# Patient Record
Sex: Female | Born: 1945 | Race: White | Hispanic: No | Marital: Married | Smoking: Never smoker
Health system: Southern US, Community
[De-identification: ages and names within clinical notes are randomized; demographics above are authoritative.]

## PROBLEM LIST (undated history)

## (undated) ENCOUNTER — Emergency Department (HOSPITAL_COMMUNITY): Admission: EM

## (undated) DIAGNOSIS — E7800 Pure hypercholesterolemia, unspecified: Secondary | ICD-10-CM

## (undated) DIAGNOSIS — F419 Anxiety disorder, unspecified: Secondary | ICD-10-CM

## (undated) DIAGNOSIS — R569 Unspecified convulsions: Secondary | ICD-10-CM

## (undated) DIAGNOSIS — I639 Cerebral infarction, unspecified: Secondary | ICD-10-CM

## (undated) DIAGNOSIS — R1310 Dysphagia, unspecified: Secondary | ICD-10-CM

## (undated) HISTORY — DX: Pure hypercholesterolemia, unspecified: E78.00

## (undated) HISTORY — DX: Dysphagia, unspecified: R13.10

## (undated) HISTORY — DX: Anxiety disorder, unspecified: F41.9

---

## 2000-01-05 HISTORY — PX: OTHER SURGICAL HISTORY: SHX169

## 2004-12-28 ENCOUNTER — Other Ambulatory Visit: Admission: RE | Admit: 2004-12-28 | Discharge: 2004-12-28 | Payer: Self-pay | Admitting: Family Medicine

## 2006-08-25 ENCOUNTER — Other Ambulatory Visit: Admission: RE | Admit: 2006-08-25 | Discharge: 2006-08-25 | Payer: Self-pay | Admitting: Family Medicine

## 2007-10-05 ENCOUNTER — Encounter: Admission: RE | Admit: 2007-10-05 | Discharge: 2007-10-05 | Payer: Self-pay | Admitting: Family Medicine

## 2008-05-09 ENCOUNTER — Other Ambulatory Visit: Admission: RE | Admit: 2008-05-09 | Discharge: 2008-05-09 | Payer: Self-pay | Admitting: Family Medicine

## 2008-05-18 ENCOUNTER — Encounter: Admission: RE | Admit: 2008-05-18 | Discharge: 2008-05-18 | Payer: Self-pay | Admitting: Family Medicine

## 2009-07-01 ENCOUNTER — Other Ambulatory Visit: Admission: RE | Admit: 2009-07-01 | Discharge: 2009-07-01 | Payer: Self-pay | Admitting: Family Medicine

## 2009-07-22 ENCOUNTER — Encounter: Admission: RE | Admit: 2009-07-22 | Discharge: 2009-07-22 | Payer: Self-pay | Admitting: Family Medicine

## 2009-12-12 ENCOUNTER — Encounter: Admission: RE | Admit: 2009-12-12 | Discharge: 2009-12-12 | Payer: Self-pay | Admitting: Sports Medicine

## 2010-03-11 ENCOUNTER — Ambulatory Visit (HOSPITAL_COMMUNITY): Admission: RE | Admit: 2010-03-11 | Discharge: 2010-03-11 | Payer: Self-pay

## 2010-04-03 HISTORY — PX: OTHER SURGICAL HISTORY: SHX169

## 2010-07-06 HISTORY — PX: BACK SURGERY: SHX140

## 2010-08-10 ENCOUNTER — Other Ambulatory Visit
Admission: RE | Admit: 2010-08-10 | Discharge: 2010-08-10 | Payer: Self-pay | Source: Home / Self Care | Admitting: Family Medicine

## 2010-08-10 ENCOUNTER — Encounter: Admission: RE | Admit: 2010-08-10 | Discharge: 2010-08-10 | Payer: Self-pay | Admitting: Family Medicine

## 2014-01-22 ENCOUNTER — Ambulatory Visit
Admission: RE | Admit: 2014-01-22 | Discharge: 2014-01-22 | Disposition: A | Discharge status: Home / Self Care | Payer: Commercial Managed Care - HMO | Source: Ambulatory Visit | Attending: Physician Assistant | Admitting: Physician Assistant

## 2014-01-22 ENCOUNTER — Other Ambulatory Visit: Payer: Self-pay | Admitting: Physician Assistant

## 2014-01-22 DIAGNOSIS — M79673 Pain in unspecified foot: Secondary | ICD-10-CM

## 2014-09-24 DIAGNOSIS — I639 Cerebral infarction, unspecified: Secondary | ICD-10-CM

## 2014-09-24 HISTORY — DX: Cerebral infarction, unspecified: I63.9

## 2014-10-08 ENCOUNTER — Ambulatory Visit (INDEPENDENT_AMBULATORY_CARE_PROVIDER_SITE_OTHER): Payer: Commercial Managed Care - HMO | Admitting: Neurology

## 2014-10-08 ENCOUNTER — Encounter: Payer: Self-pay | Admitting: Neurology

## 2014-10-08 ENCOUNTER — Other Ambulatory Visit: Payer: Self-pay | Admitting: Neurology

## 2014-10-08 VITALS — BP 126/80 | HR 85 | Ht 64.5 in | Wt 157.0 lb

## 2014-10-08 DIAGNOSIS — R519 Headache, unspecified: Secondary | ICD-10-CM | POA: Insufficient documentation

## 2014-10-08 DIAGNOSIS — R404 Transient alteration of awareness: Secondary | ICD-10-CM

## 2014-10-08 DIAGNOSIS — G44229 Chronic tension-type headache, not intractable: Secondary | ICD-10-CM

## 2014-10-08 DIAGNOSIS — R51 Headache: Secondary | ICD-10-CM

## 2014-10-08 DIAGNOSIS — R0683 Snoring: Secondary | ICD-10-CM

## 2014-10-08 DIAGNOSIS — R55 Syncope and collapse: Secondary | ICD-10-CM

## 2014-10-08 DIAGNOSIS — R4182 Altered mental status, unspecified: Secondary | ICD-10-CM | POA: Insufficient documentation

## 2014-10-08 DIAGNOSIS — R0681 Apnea, not elsewhere classified: Secondary | ICD-10-CM

## 2014-10-08 MED ORDER — LEVETIRACETAM 500 MG PO TABS: 500.0000 mg | ORAL_TABLET | Freq: Two times a day (BID) | ORAL | Status: DC

## 2014-10-08 NOTE — Patient Instructions (Signed)
Overall you are doing fairly well but I do want to suggest a few things today:   Remember to drink plenty of fluid, eat healthy meals and do not skip any meals. Try to eat protein with a every meal and eat a healthy snack such as fruit or nuts in between meals. Try to keep a regular sleep-wake schedule and try to exercise daily, particularly in the form of walking, 20-30 minutes a day, if you can.   As far as your medications are concerned, I would like to suggest: Daily aspirin 81mg .   As far as diagnostic testing: Sleep study to evaluate for sleep apnea, EEG. Agree that cardiology workup is clinically indicated  I would like to see you back in 4 months, sooner if we need to. Please call us with any interim questions, concerns, problems, updates or refill requests.   Please also call us for any test results so we can go over those with you on the phone.  My clinical assistant and will answer any of your questions and relay your messages to me and also relay most of my messages to you.   Our phone number is [phone]. We also have an after hours call service for urgent matters and there is a physician on-call for urgent questions. For any emergencies you know to call 911 or go to the nearest emergency room

## 2014-10-08 NOTE — Addendum Note (Signed)
Addended by: Naomie DeanAHERN, Preciosa Bundrick B on: 10/08/2014 06:18 PM   Modules accepted: Level of Service

## 2014-10-08 NOTE — Procedures (Signed)
      History: Lindsey Lopez is a 69 year old patient with a recent blackout while visiting [place] around the 19th of January 2016. The patient blacked out, fell backwards. There was some transient weakness on the left greater than right side. The patient is being evaluated for the blackout.  This is a routine EEG. No skull defects are noted. Medications include vitamin D, Celexa, Plavix, Flexeril, Nexium, Prevacid, Mobic, Toprol, Lopressor, and vitamin E.  EEG classification: Dysrhythmia grade 2 left temporal  Description of the recording: The background rhythms of this recording consists of a relatively well-modulated medium amplitude alpha rhythm of 10 Hz that is reactive to eye opening and closure. As the record progresses, there appears to be relatively frequent dysrhythmic theta activity emanating from the left mid temporal lobe, occasionally associated with sharp transients. Photic stimulation is performed, and this results in a bilateral and symmetric photic driving response. A hyperventilation was performed, and this results in a minimal buildup of the background rhythm activities without significant slowing seen. At no time during this recording does there appear to be evidence of actual spike or spike-wave discharges. EKG monitor shows no evidence of cardiac rhythm abnormalities with a heart rate of 78.  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 No electrographic seizures were recorded, however.

## 2014-10-08 NOTE — Progress Notes (Signed)
[facility]    Provider:  Dr Lucia Gaskins Referring Provider: [name], NP Primary Care Physician:  [name], NP  CC:  Loss of consciousness  HPI:  Lindsey Lopez is a 69 y.o. female here as a referral from Dr. Shayne Alken for loss of consciousness. They drove to [place], on the 19th went to [place] [employer]. She was standing in line for a show and she felt like she was falling backwards. She was out for 10-15 minutes, totally blacked out. Husband provides most of information. Eyes closed. Everything turned black. No abnormal movements, loss of bowel or blader. She just laid there. There was no response. She was breathing, she has no recollection of events. Gradually she began to open her eyes but couldn't respond. A nurse in the crowd helped, and the more the nurse worked with her, the more responsive she became. Right arm and right leg weren't moving as well as the left side. The right side of the face drooped. After 15 minutes she responded better, they sat her up, This is the first time, never happened before. No inciting factors. She slowly got better, was very tired afterwards, slept for 2 days. She sometimes has some word-finding difficulties otherwise back to normal. She is on aspirin now (was not before). She is on a statin. No diabetes. There were no new medications or other inciting events.  EEg slowed bitemporal slowing. No personal or FHx of seizures. Never had a stroke.   Headaches are every day. Bitemporal pressure. She is very tense. Headache wake her up in the middle of the night. When she rolls on her back she starts breathing through her mouth, significant snoring to the point she wakes herself. She is excessively tired during the day (however Epworth scale only 2). She takes naps often. She has gained 25-30 pounds recently. She has witnessed apneic events by husband. Wakes up with headaches. Daily headaches.   Reviewed notes, labs and imaging from outside physicians,  which showed: MRI of the brain w/wo contrast unremarkable with some non-specific white matter periventricular increased signal, no infarct or mass or bleed, normal architecture, MRi of the cervical spine with postoperative changes with anterior cervical diskectomy and instrumentation c3-c7, solid fusion c3-c5, stenosis at c7-t1 and t1-t2 without cord flattening, no fracture or cord lesion. MRA neck w/wo: no significant stenosis interna carotid arteries or vertebral arteries. MRA head, normal. TEE with normal EF, abnormal left vent diastolic dysfunction, mild MR, trace TR, left vent nml size. Carotid duplex without hemodynamically significant stenosis in the bilat common, internal and external carotid arteries. EEG abnormal, intermittent bitemporal slpwing without interictal or epileptiform abnormalities.   Review of Systems: Patient complains of symptoms per HPI as well as the following symptoms: snoring, flushing, dizziness, passing out, aching muscles, spinning. Pertinent negatives per HPI. All others negative.   History   Social History  . Marital Status: Married    Spouse Name: [name]     Number of Children: 1  . Years of Education: College   Occupational History  . Retired    Social History Main Topics  . Smoking status: Never Smoker   . Smokeless tobacco: Not on file  . Alcohol Use: No  . Drug Use: No  . Sexual Activity: Not on file   Other Topics Concern  . Not on file   Social History Narrative   Lives at home with husband, Lindsey Lopez.   She is retired.    Has one child  Caffeine use: 1 cup a day    Family History  Problem Relation Age of Onset  . Prostate cancer Brother   . Cancer Mother   . Cancer Father   . Heart attack Mother   . Heart attack Father   . Heart attack Maternal Grandmother   . Heart attack Maternal Grandfather     Past Medical History  Diagnosis Date  . High cholesterol   . Anxiety     Past Surgical History  Procedure Laterality Date  .  Neck fusion  04-03-10    4 leve   . Back surgery  07-06-10    "Drill out" surgery on lower back  . Herniated disc  01-2000    Current Outpatient Prescriptions  Medication Sig Dispense Refill  . Cholecalciferol (VITAMIN D3) 2000 UNITS TABS Take 1 tablet by mouth daily.    . citalopram (CELEXA) 10 MG tablet Take 10 mg by mouth daily.    . cyclobenzaprine (FLEXERIL) 10 MG tablet Take 10 mg by mouth daily.    Marland Kitchen esomeprazole (NEXIUM) 20 MG capsule Take 20 mg by mouth daily at 12 noon.    . fluvastatin XL (LESCOL XL) 80 MG 24 hr tablet Take 80 mg by mouth daily.    . Lansoprazole (PREVACID PO) Take 1 tablet by mouth daily.    . meloxicam (MOBIC) 15 MG tablet Take 15 mg by mouth daily.    . metoprolol succinate (TOPROL-XL) 25 MG 24 hr tablet Take 12.5 mg by mouth daily.    . metoprolol tartrate (LOPRESSOR) 12.5 mg TABS tablet Take 12.5 mg by mouth 2 (two) times daily.    Marland Kitchen VITAMIN E PO Take 2,000 Units by mouth daily.    . clopidogrel (PLAVIX) 75 MG tablet Take 75 mg by mouth daily.    Marland Kitchen levETIRAcetam (KEPPRA) 500 MG tablet Take 1 tablet (500 mg total) by mouth 2 (two) times daily. 60 tablet 6   No current facility-administered medications for this visit.    Allergies as of 10/08/2014 - Review Complete 10/08/2014  Allergen Reaction Noted  . Codeine Anaphylaxis 10/08/2014  . Phenobarbital  10/08/2014  . Ultram [tramadol]  10/08/2014    Vitals: BP 126/80 mmHg  Pulse 85  Ht 5' 4.5" (1.638 m)  Wt 157 lb (71.215 kg)  BMI 26.54 kg/m2 Last Weight:  Wt Readings from Last 1 Encounters:  10/08/14 157 lb (71.215 kg)   Last Height:   Ht Readings from Last 1 Encounters:  10/08/14 5' 4.5" (1.638 m)  Physical exam: Exam: Gen: NAD, conversant, well nourised, overweight, well groomed                     CV: RRR, no MRG. No Carotid Bruits. No peripheral edema, warm, nontender Eyes: Conjunctivae clear without exudates or hemorrhage  Neuro: Detailed Neurologic Exam  Speech:    Speech is  normal; fluent and spontaneous with normal comprehension.  Cognition:    The patient is oriented to person, place, and time;     recent and remote memory intact;     language fluent;     normal attention, concentration,     fund of knowledge Cranial Nerves:    The pupils are equal, round, and reactive to light. The fundi are normal and spontaneous venous pulsations are present. Visual fields are full to finger confrontation. Extraocular movements are intact. Trigeminal sensation is intact and the muscles of mastication are normal. The face is symmetric. The palate elevates in the midline.  Hearing intact. Voice is normal. Shoulder shrug is normal. The tongue has normal motion without fasciculations.   Coordination:    No dysmetria  Gait:    Heel-toe and tandem gait are normal.   Motor Observation:    No asymmetry, no atrophy, and no involuntary movements noted. Tone:    Normal muscle tone.    Posture:    Posture is normal. normal erect    Strength:    Strength is V/V in the upper and lower limbs.      Sensation: intact to LT     Reflex Exam:  DTR's:    Deep tendon reflexes in the upper and lower extremities are normal bilaterally.   Toes:    The toes are downgoing bilaterally.   Clonus:    Clonus is absent.        Assessment/Plan:  69 year old female with loss of consciousness, right-sided weakness. 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 No electrographic seizures were recorded, however.) Personally reviewed EEG and agree with findings.   Will start patient on Keppra 500mg  bid. Advised no driving until 6 months - 1 year seizure free based  on locality laws, do not bathe or swim alone or do anything that could cause harm should you have a seizure. Follow up in 3 months.   Headache wake her up in the middle of the night. When she rolls on her back she starts breathing through her mouth, significant snoring to the point she wakes herself. She is excessively tired during the day (however Epworth scale only 2). She takes naps often. She has gained 25-30 pounds recently. She has witnessed apneic events by husband. Wakes up with headaches. Daily headaches.  Will order sleep study and have sleep team evaluate for appropriateness of study.  Continue ASA 81 and statin for stroke prevention. Follow closely with pcp for management of vascular risk factors. Recommend cardiology consult for syncopal episode that doesn't exactly fit seizure or stroke, possibly consider halter monitor.    Naomie DeanAntonia Jadelynn Boylan, MD  [facility] [address] [place] [id number]  Phone [phone] Fax [phone]

## 2014-10-09 NOTE — Progress Notes (Signed)
Done

## 2014-10-15 ENCOUNTER — Telehealth: Payer: Self-pay | Admitting: Neurology

## 2014-10-15 DIAGNOSIS — G479 Sleep disorder, unspecified: Secondary | ICD-10-CM

## 2014-10-15 DIAGNOSIS — G44011 Episodic cluster headache, intractable: Secondary | ICD-10-CM

## 2014-10-15 DIAGNOSIS — R5383 Other fatigue: Secondary | ICD-10-CM

## 2014-10-15 DIAGNOSIS — R0683 Snoring: Secondary | ICD-10-CM

## 2014-10-15 NOTE — Telephone Encounter (Signed)
Dr. Naomie DeanAntonia Ahern, refers patient for attended sleep study.  Height: 5' 4.5"  Weight: 157 lb  BMI: 26.54   Past Medical History:  High Cholesterol Anxiety   Sleep Symptoms: Headache wake her up in the middle of the night. When she rolls on her back she starts breathing through her mouth, significant snoring to the point she wakes herself. She is excessively tired during the day.She takes naps often. She has gained 25-30 pounds recently. She has witnessed apneic events by husband. Wakes up with headaches. Daily headaches.    Epworth Score: (however Epworth scale only 2)   Medication:  Cholecalciferol (Tab) Vitamin D3 2000 UNITS Take 1 tablet by mouth daily.       Citalopram Hydrobromide (Tab) CELEXA 10 MG Take 10 mg by mouth daily.      Clopidogrel Bisulfate (Tab) PLAVIX 75 MG Take 75 mg by mouth daily.      Cyclobenzaprine HCl (Tab) FLEXERIL 10 MG Take 10 mg by mouth daily.      Esomeprazole Magnesium (Capsule Delayed Release) NEXIUM 20 MG Take 20 mg by mouth daily at 12 noon.      Fluvastatin Sodium (Tablet SR 24 hr) LESCOL XL 80 MG Take 80 mg by mouth daily.      Lansoprazole   Take 1 tablet by mouth daily.      LevETIRAcetam (Tab) KEPPRA 500 MG Take 1 tablet (500 mg total) by mouth 2 (two) times daily.      Meloxicam (Tab) MOBIC 15 MG Take 15 mg by mouth daily.      Metoprolol Succinate (Tablet SR 24 hr) TOPROL-XL 25 MG Take 12.5 mg by mouth daily.      Metoprolol Tartrate (Tab) LOPRESSOR 12.5 mg Take 12.5 mg by mouth 2 (two) times daily.      Vitamin E   Take 2,000 Units by mouth daily.       Ins: [employer]   Assessment & Plan: 69 year old female with loss of consciousness, right-sided weakness. 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 No electrographic seizures were recorded, however.) Personally reviewed EEG and agree with findings.   Will start patient on Keppra 500mg  bid. Advised no driving until 6 months - 1 year seizure free based on locality laws, do not bathe or swim alone or do anything that could cause harm should you have a seizure. Follow up in 3 months.   Headache wake her up in the middle of the night. When she rolls on her back she starts breathing through her mouth, significant snoring to the point she wakes herself. She is excessively tired during the day (however Epworth scale only 2). She takes naps often. She has gained 25-30 pounds recently. She has witnessed apneic events by husband. Wakes up with headaches. Daily headaches. Will order sleep study and have sleep team evaluate for appropriateness of study.  Continue ASA 81 and statin for stroke prevention. Follow closely with pcp for management of vascular risk factors. Recommend cardiology consult for syncopal episode that doesn't exactly fit seizure or stroke, possibly consider halter monitor.     Please review patient information and submit instructions for scheduling and orders for sleep technologist. Thank you.

## 2014-10-15 NOTE — Telephone Encounter (Signed)
This patient suffers from nocturnal headaches, she also states that she is not sleepy but extremely fatigued. This may relate to hypercapnia or hypoxemia. A sleep study in split-night formation is ordered for this patient, with Medicare criteria vertical split at an AHI of 20 and score at 4%.

## 2014-10-30 ENCOUNTER — Ambulatory Visit (INDEPENDENT_AMBULATORY_CARE_PROVIDER_SITE_OTHER): Payer: Commercial Managed Care - HMO | Admitting: Neurology

## 2014-10-30 VITALS — BP 133/84 | HR 86 | Ht 64.5 in | Wt 157.0 lb

## 2014-10-30 DIAGNOSIS — G4730 Sleep apnea, unspecified: Secondary | ICD-10-CM

## 2014-10-30 DIAGNOSIS — R5383 Other fatigue: Secondary | ICD-10-CM

## 2014-10-30 DIAGNOSIS — G479 Sleep disorder, unspecified: Secondary | ICD-10-CM

## 2014-10-30 DIAGNOSIS — R0683 Snoring: Secondary | ICD-10-CM

## 2014-10-30 DIAGNOSIS — G44011 Episodic cluster headache, intractable: Secondary | ICD-10-CM

## 2014-10-31 NOTE — Sleep Study (Signed)
See attached document in Encounters tab 

## 2014-11-12 ENCOUNTER — Encounter: Payer: Self-pay | Admitting: Neurology

## 2014-11-15 ENCOUNTER — Encounter: Payer: Self-pay | Admitting: Neurology

## 2014-11-18 ENCOUNTER — Encounter: Payer: Self-pay | Admitting: Internal Medicine

## 2014-11-18 ENCOUNTER — Telehealth: Payer: Self-pay | Admitting: *Deleted

## 2014-11-18 ENCOUNTER — Ambulatory Visit (INDEPENDENT_AMBULATORY_CARE_PROVIDER_SITE_OTHER): Payer: Commercial Managed Care - HMO | Admitting: Internal Medicine

## 2014-11-18 ENCOUNTER — Other Ambulatory Visit: Payer: Self-pay | Admitting: Neurology

## 2014-11-18 ENCOUNTER — Encounter: Payer: Self-pay | Admitting: *Deleted

## 2014-11-18 VITALS — BP 122/78 | HR 87 | Ht 64.5 in | Wt 155.6 lb

## 2014-11-18 DIAGNOSIS — G4733 Obstructive sleep apnea (adult) (pediatric): Secondary | ICD-10-CM

## 2014-11-18 DIAGNOSIS — R55 Syncope and collapse: Secondary | ICD-10-CM

## 2014-11-18 NOTE — Patient Instructions (Signed)
Your physician recommends that you continue on your current medications as directed. Please refer to the Current Medication list given to you today.  Your physician recommends that you have a stress myoview.  Your physician has recommended that you wear an event monitor. Event monitors are medical devices that record the heart's electrical activity. Doctors most often us these monitors to diagnose arrhythmias. Arrhythmias are problems with the speed or rhythm of the heartbeat. The monitor is a small, portable device. You can wear one while you do your normal daily activities. This is usually used to diagnose what is causing palpitations/syncope (passing out).  Your physician recommends that you schedule a follow-up appointment in: 6 weeks with Dr. Graciela HusbandsKlein.  No driving for 6 months.

## 2014-11-18 NOTE — Progress Notes (Signed)
ELECTROPHYSIOLOGY CONSULT NOTE  Patient ID: Lindsey Lopez, MRN: [id number], DOB/AGE: 68/28/1947 68 y.o. Admit date: (Not on file) Date of Consult: 11/18/2014  Primary Physician: [name], NP Primary Cardiologist: new  Chief Complaint: spell   HPI Lindsey Lopez is a 69 y.o. female  Referred for evaluation of a spell that occurred while she was at [employer]. This history is gathered from outpatient notes and discussions with the neurologist and her husband.  She was standing and lying. She had a sensation of spinning. She cried out. Her husband caught her before she fell. She was apparently unconscious and not moving with her eyes closed for about 10 minutes. As she became arousable, she was noted to have right arm right leg weakness as well as a right facial droop. It is also noteworthy that nurses who happened to be in the same line noted a palpable pulse almost from the beginning. She was taken to a local hospital she was felt to have had a stroke. She was given an injection of some kind. Cardiac evaluation included Dopplers and echocardiogram reports of which were reviewed and were described as normal.  An EEG at that time was abnormal. The EEG has been repeated in the neurologist office here and is also abnormal. She has been started on Keppra.  She has a history of tachypalpitations that are abrupt in onset and lasting minutes. They're associated with some lightheadedness and chest discomfort and shortness of breath. They're frog negative and diuretic negative. There is no clear precipitant.  She has also had dyspnea on exertion as well as exertional chest discomfort. This abates with rest and is reproducible.    she has a history of remote syncope when she was young. She also has a history of recurrent presyncope without a specific prodrome.     Past Medical History  Diagnosis Date  . High cholesterol   . Anxiety       Surgical History:  Past Surgical History    Procedure Laterality Date  . Neck fusion  04-03-10    4 leve   . Back surgery  07-06-10    "Drill out" surgery on lower back  . Herniated disc  01-2000     Home Meds: Prior to Admission medications   Medication Sig Start Date End Date Taking? Authorizing Provider  Cholecalciferol (VITAMIN D3) 2000 UNITS TABS Take 1 tablet by mouth daily.   Yes Historical Provider, MD  citalopram (CELEXA) 10 MG tablet Take 10 mg by mouth daily.   Yes Historical Provider, MD  clopidogrel (PLAVIX) 75 MG tablet Take 75 mg by mouth daily.   Yes Historical Provider, MD  cyclobenzaprine (FLEXERIL) 10 MG tablet Take 10 mg by mouth daily.   Yes Historical Provider, MD  esomeprazole (NEXIUM) 20 MG capsule Take 20 mg by mouth daily at 12 noon.   Yes Historical Provider, MD  fluvastatin XL (LESCOL XL) 80 MG 24 hr tablet Take 80 mg by mouth daily.   Yes Historical Provider, MD  Lansoprazole (PREVACID PO) Take 1 tablet by mouth daily.   Yes Historical Provider, MD  levETIRAcetam (KEPPRA) 500 MG tablet Take 1 tablet (500 mg total) by mouth 2 (two) times daily. 10/08/14  Yes [name], MD  meloxicam (MOBIC) 15 MG tablet Take 15 mg by mouth daily.   Yes Historical Provider, MD  metoprolol succinate (TOPROL-XL) 25 MG 24 hr tablet Take 12.5 mg by mouth daily. 09/11/14  Yes Historical Provider, MD  VITAMIN E  PO Take 2,000 Units by mouth daily.   Yes Historical Provider, MD      Allergies:  Allergies  Allergen Reactions  . Codeine Anaphylaxis  . Phenobarbital   . Ultram [Tramadol]     History   Social History  . Marital Status: Married    Spouse Name: [name]   . Number of Children: 1  . Years of Education: College   Occupational History  . Retired    Social History Main Topics  . Smoking status: Never Smoker   . Smokeless tobacco: Not on file  . Alcohol Use: No  . Drug Use: No  . Sexual Activity: Not on file   Other Topics Concern  . Not on file   Social History Narrative   Lives at home with  husband, Lakresha Stifter.   She is retired.    Has one child   Caffeine use: 1 cup a day     Family History  Problem Relation Age of Onset  . Prostate cancer Brother   . Cancer Mother   . Cancer Father   . Heart attack Mother   . Heart attack Father   . Heart attack Maternal Grandmother   . Heart attack Maternal Grandfather      ROS:  Please see the history of present illness.   Negative except Depression, bruisability, balance issues, snoring, blurry vision, and headaches.  All other systems reviewed and negative.    Physical Exam:    Blood pressure 122/78, pulse 87, height 5' 4.5" (1.638 m), weight 155 lb 9.6 oz (70.58 kg). General: Well developed, well nourished female in no acute distress. Head: Normocephalic, atraumatic, sclera non-icteric, no xanthomas, nares are without discharge. EENT: normal Lymph Nodes:  none Back: without scoliosis/kyphosis, no CVA tendersness Neck: Negative for carotid bruits. JVD not elevated. Lungs: Clear bilaterally to auscultation without wheezes, rales, or rhonchi. Breathing is unlabored. Heart: RRR with S1 S2. No  murmur , rubs, or gallops appreciated. Abdomen: Soft, non-tender, non-distended with normoactive bowel sounds. No hepatomegaly. No rebound/guarding. No obvious abdominal masses. Msk:  Strength and tone appear normal for age. Extremities: No clubbing or cyanosis. No edema.  Distal pedal pulses are 2+ and equal bilaterally. Skin: Warm and Dry Neuro: Alert and oriented X 3. CN III-XII intact Grossly normal sensory and motor function . Psych:  Responds to questions appropriately with a normal affect.      Labs: Cardiac Enzymes No results for input(s): CKTOTAL, CKMB, TROPONINI in the last 72 hours. CBC No results found for: WBC, HGB, HCT, MCV, PLT PROTIME: No results for input(s): LABPROT, INR in the last 72 hours. Chemistry No results for input(s): NA, K, CL, CO2, BUN, CREATININE, CALCIUM, PROT, BILITOT, ALKPHOS, ALT, AST, GLUCOSE in  the last 168 hours.  Invalid input(s): LABALBU Lipids No results found for: CHOL, HDL, LDLCALC, TRIG BNP No results found for: PROBNP Miscellaneous No results found for: DDIMER  Radiology/Studies:  No results found.  EKG:  for today and demonstrated sinus rhythm at 80 Intervals 17/09/42 Nonspecific ST changes   Assessment and Plan:   LOC spells  Tachypalpitations  Exertional chest Discomfort  DOE     a protracted spell with closed eyes and no movement is unusual for a cardiovascular spell. It raises the possibility of pseudo-syncope. Discordant with this would be the right sided weakness that was identified, the EEG abnormalities and the apparent imaging abnormalities.  It is not clear to me from the discussions whether she was thought to have had  a stroke or TIA or not. With her history however of tachypalpitations is reasonable, given their frequency, to utilize an event recorder to try to clarify the mechanism to see if they represent atrial fibrillation and/or atrial flutter which would prompt the use of anticoagulant.   In addition, we will undertake a Myoview scan to see if ischemia is contributing to her exertional discomfort and dyspnea.   She is advised not to drive for 6 months from the date of her event.  Discussed with Neurologist       Sherryl MangesSteven Klein

## 2014-11-18 NOTE — Telephone Encounter (Signed)
Patient returned my phone call and was provided the results of her over night sleep study.  Patient was informed of the diagnosis of mild sleep apnea and was informed that treatment was advised in the form of CPAP therapy.  The patient was in agreement and was referred to [facility] for CPAP set up.  Dr. Lucia GaskinsAhern was routed a copy of the results.   Patient instructed to contact our office 6-8 weeks post set up to schedule a follow up appointment.  The patient gave verbal permission to mail a copy of her test results.

## 2014-11-25 ENCOUNTER — Institutional Professional Consult (permissible substitution): Payer: Commercial Managed Care - HMO | Admitting: Internal Medicine

## 2014-11-28 ENCOUNTER — Ambulatory Visit (HOSPITAL_COMMUNITY): Payer: Commercial Managed Care - HMO | Attending: Cardiology | Admitting: Radiology

## 2014-11-28 ENCOUNTER — Encounter (INDEPENDENT_AMBULATORY_CARE_PROVIDER_SITE_OTHER): Payer: Commercial Managed Care - HMO

## 2014-11-28 ENCOUNTER — Encounter: Payer: Self-pay | Admitting: Radiology

## 2014-11-28 ENCOUNTER — Telehealth: Payer: Self-pay | Admitting: *Deleted

## 2014-11-28 DIAGNOSIS — R55 Syncope and collapse: Secondary | ICD-10-CM

## 2014-11-28 DIAGNOSIS — R002 Palpitations: Secondary | ICD-10-CM | POA: Diagnosis not present

## 2014-11-28 DIAGNOSIS — R0609 Other forms of dyspnea: Secondary | ICD-10-CM | POA: Insufficient documentation

## 2014-11-28 DIAGNOSIS — R079 Chest pain, unspecified: Secondary | ICD-10-CM | POA: Diagnosis present

## 2014-11-28 DIAGNOSIS — I10 Essential (primary) hypertension: Secondary | ICD-10-CM | POA: Diagnosis not present

## 2014-11-28 DIAGNOSIS — R0602 Shortness of breath: Secondary | ICD-10-CM | POA: Insufficient documentation

## 2014-11-28 DIAGNOSIS — R6889 Other general symptoms and signs: Secondary | ICD-10-CM

## 2014-11-28 MED ORDER — TECHNETIUM TC 99M SESTAMIBI GENERIC - CARDIOLITE
11.0000 | Freq: Once | INTRAVENOUS | Status: AC | PRN
  Administered 2014-11-28: 11 via INTRAVENOUS

## 2014-11-28 MED ORDER — TECHNETIUM TC 99M SESTAMIBI GENERIC - CARDIOLITE
33.0000 | Freq: Once | INTRAVENOUS | Status: AC | PRN
  Administered 2014-11-28: 33 via INTRAVENOUS

## 2014-11-28 NOTE — Telephone Encounter (Signed)
Lindsey Lopez calling about pt.  She is there with husband and was having the cpap placed and pt was doing well until after 5 min of wearing the mask , passed out.  C/o R arm weakness, similar episode , is being worked by CD, had cardiac event monitor placed today.  O2 sat 96% and HR 88, slight diaphoretic.  She is not normally claustrophobic.    Lasted about 5-10 minutes.  Lindsey Lopez wanted to have pt come in here today.   ? Related to cpap or other issue.  Pt refused to have 911 called.

## 2014-11-28 NOTE — Progress Notes (Signed)
  [facility] SITE 3 NUCLEAR MED [address] [place], [place] [address] [phone]    Cardiology Nuclear Med Study  Deborah ChalkLeeanne Lyda JesterCurtis is a 69 y.o. female     MRN : [id number]     DOB: 25-Nov-1945  Procedure Date: 11/28/2014  Nuclear Med Background Indication for Stress Test:  Evaluation for Ischemia History:  12 yrs ago MPI: NL per pt, H/O Seizures Cardiac Risk Factors: Hypertension  Symptoms:  Chest Pain, DOE, Palpitations, SOB and Syncope   Nuclear Pre-Procedure Caffeine/Decaff Intake:  None NPO After: 7:00pm   Lungs:  clear O2 Sat: 95% on room air. IV 0.9% NS with Angio Cath:  22g  IV Site: R Hand  IV Started by:  Cathlyn Parsonsynthia Hasspacher, RN  Chest Size (in):  36 Cup Size: D  Height: 5\' 4"  (1.626 m)  Weight:  153 lb (69.4 kg)  BMI:  Body mass index is 26.25 kg/(m^2). Tech Comments:  No Toprol x 36 hrs    Nuclear Med Study 1 or 2 day study: 1 day  Stress Test Type:  Stress  Reading MD: n/a  Order Authorizing Provider:  Ferman HammingSteven Klein,MD  Resting Radionuclide: Technetium 701m Sestamibi  Resting Radionuclide Dose: 11.0 mCi   Stress Radionuclide:  Technetium 401m Sestamibi  Stress Radionuclide Dose: 33.0 mCi           Stress Protocol Rest HR: 70 Stress HR: 142  Rest BP: 115/81 Stress BP: 195/82  Exercise Time (min): 6:00 METS: 7.0   Predicted Max HR: 153 bpm % Max HR: 92.81 bpm Rate Pressure Product: [id number]   Dose of Adenosine (mg):  n/a Dose of Lexiscan: n/a mg  Dose of Atropine (mg): n/a Dose of Dobutamine: n/a mcg/kg/min (at max HR)  Stress Test Technologist: Milana NaSabrina Williams, EMT-P  Nuclear Technologist:  Kerby NoraElzbieta Kubak, CNMT     Rest Procedure:  Myocardial perfusion imaging was performed at rest 45 minutes following the intravenous administration of Technetium 511m Sestamibi. Rest ECG: NSR - Normal EKG  Stress Procedure:  The patient exercised on the treadmill utilizing the Bruce Protocol for 6:00 minutes. The patient stopped due to doe, fatigue, and denied  any chest pain.  Technetium 211m Sestamibi was injected at peak exercise and myocardial perfusion imaging was performed after a brief delay. Stress ECG: No significant change from baseline ECG  QPS Raw Data Images:  Normal; no motion artifact; normal heart/lung ratio. Stress Images:  Normal homogeneous uptake in all areas of the myocardium. Rest Images:  Normal homogeneous uptake in all areas of the myocardium. Subtraction (SDS):  There is no evidence of scar or ischemia. Transient Ischemic Dilatation (Normal <1.22):  0.87 Lung/Heart Ratio (Normal <0.45):  0.39  Quantitative Gated Spect Images QGS EDV:  71 ml QGS ESV:  30 ml  Impression Exercise Capacity:  Fair exercise capacity. BP Response:  Normal blood pressure response. Clinical Symptoms:  Dyspnea, fatigue.  ECG Impression:  No significant ST segment change suggestive of ischemia. Comparison with Prior Nuclear Study: No images to compare  Overall Impression:  Normal stress nuclear study.  LV Ejection Fraction: 57%.  LV Wall Motion:  NL LV Function; NL Wall Motion   Marca AnconaDalton McLean 11/28/2014

## 2014-11-28 NOTE — Progress Notes (Signed)
Patient ID: Lindsey Lopez, female   DOB: 06-03-1946, 69 y.o.   MRN: [id number] Lifewatch 30 day monitor applied. EOS 12-28-14

## 2014-11-28 NOTE — Telephone Encounter (Signed)
Dr. Vickey Hugerohmeier - did you happen to witness the event? Please let me know, would appreciate your professional opinion on this. Thank you

## 2014-11-28 NOTE — Progress Notes (Signed)
Patient ID: Lindsey Lopez, female   DOB: 1946-01-19, 69 y.o.   MRN: [id number] Lifewatch 30 day Monitor applied. EOS 12-28-14

## 2014-11-28 NOTE — Telephone Encounter (Signed)
I called Lindsey Lopez back.   Pt is doing better now.  Is ok to go home with cpap.  She wanted to let Dr. Lucia GaskinsAhern know about episode.  I will forward.

## 2014-11-29 NOTE — Telephone Encounter (Signed)
I have never heard of a CPAP-induced syncope.  I think it would be interesting to have her come in for  an EEG and then place the CPAP on her- if this is a repeat event,  may be become captured on EEG !!.

## 2014-12-04 ENCOUNTER — Telehealth: Payer: Self-pay | Admitting: *Deleted

## 2014-12-04 NOTE — Telephone Encounter (Signed)
Dr Lucia GaskinsAhern patient.

## 2014-12-04 NOTE — Telephone Encounter (Signed)
Yes, I would love to set up an EEG and put her on cpap and see if we can catch an episode. Should I ask Kara Meadmma to schedule on a certain time or day? I guess we will need someone from the sleep lab to place the cpap during the eeg. Thank you! Please let me know.

## 2014-12-04 NOTE — Telephone Encounter (Signed)
I called pt.  She has been wearing the CPAP since the first time she tried it on and has not had a problem.  She thinks that when she over does it that is when she has issues.   She will keep us informed if problems.  Please let me know if you want to proceed with EEG/cpap and will call her back, if not she will f/u as per previously scheduled.

## 2014-12-05 NOTE — Telephone Encounter (Signed)
I spoke with pt again after speaking with Dr. Lucia GaskinsAhern.  Would like to proceed to EEG and cpap.  Pt informed of appt when Dr. Lucia GaskinsAhern here, and to bring her cpap machine.  She verbalized understanding.

## 2014-12-11 ENCOUNTER — Encounter (INDEPENDENT_AMBULATORY_CARE_PROVIDER_SITE_OTHER): Payer: Commercial Managed Care - HMO | Admitting: Neurology

## 2014-12-11 ENCOUNTER — Other Ambulatory Visit (INDEPENDENT_AMBULATORY_CARE_PROVIDER_SITE_OTHER): Payer: Self-pay | Admitting: Neurology

## 2014-12-11 DIAGNOSIS — R404 Transient alteration of awareness: Secondary | ICD-10-CM | POA: Diagnosis not present

## 2014-12-11 DIAGNOSIS — Z0289 Encounter for other administrative examinations: Secondary | ICD-10-CM

## 2014-12-11 NOTE — Procedures (Signed)
    History: Lindsey Lopez is a 69 year old patient with a history of a blackout event that occurred on 09/24/2014. The patient has had episodes of dizziness, and loss of consciousness while wearing a CPAP mask. The patient is being reevaluated for these events.  This is a routine EEG. No skull defects are noted. Medications include vitamin D, Celexa, Plavix, Flexeril, Nexium, Lescol, Prevacid, Keppra, Mobic, Toprol, and vitamin E.  EEG classification: Dysrhythmia grade 2 left temporal  Description of the recording: The background rhythms of this recording consists of a fairly well modulated medium amplitude alpha rhythm of 10 Hz that is reactive to eye opening and closure. As the record progresses, there appears to be significant electrode artifact initially at the T3 electrode. This is corrected later on in the recording. Photic stimulation is performed, and this results in a bilateral and symmetric photic driving response. Hyperventilation was not performed. Intermittently towards the end of the recording, dysrhythmic theta activity is seen emanating from the left temporal area, occasionally associated with sharp transients. At no time during the recording does there appear to be evidence of actual spike or spike-wave discharges. EKG monitor shows no evidence of cardiac rhythm abnormalities with a heart rate of 72.  Impression: This is an abnormal EEG recording secondary to dysrhythmic theta activity emanating from the left temporal region. This study suggests a left brain abnormality, with a lowered seizure threshold. No electrographic seizures were recorded during the study. In comparison to a prior EEG done on the second of February 2016, this study shows very similar findings.

## 2015-01-06 ENCOUNTER — Ambulatory Visit (INDEPENDENT_AMBULATORY_CARE_PROVIDER_SITE_OTHER): Payer: Commercial Managed Care - HMO | Admitting: Internal Medicine

## 2015-01-06 ENCOUNTER — Encounter: Payer: Self-pay | Admitting: Internal Medicine

## 2015-01-06 VITALS — BP 138/74 | HR 81 | Ht 64.5 in | Wt 157.0 lb

## 2015-01-06 DIAGNOSIS — R55 Syncope and collapse: Secondary | ICD-10-CM | POA: Diagnosis not present

## 2015-01-06 DIAGNOSIS — R002 Palpitations: Secondary | ICD-10-CM

## 2015-01-06 NOTE — Patient Instructions (Signed)
Medication Instructions:  Your physician recommends that you continue on your current medications as directed. Please refer to the Current Medication list given to you today.  Labwork: None  Testing/Procedures: None  Follow-Up: No follow up is needed at this time with Dr. Graciela HusbandsKlein.  He will see you on an as needed basis.   Thank you for choosing [facility]!!

## 2015-01-06 NOTE — Progress Notes (Signed)
Patient Care Team: [name], NP as PCP - General (Nurse Practitioner)   HPI  Lindsey Lopez is a 69 y.o. female With recurrent spells of LOC with characteristics suggestive of non cardiovascular syncope, either neurological or pseudosyncope  She also has palpiations and possible TIA so she was given an event recorder  She is here to review that today  She also co of DOE and underwent myoview scanning 3/16 >>>Normal stress nuclear study. LV Ejection Fraction: 57%. LV Wall Motion: NL LV Function; NL Wall Motion   Echo (1/16) >> normal LV function         She has had interval syncope assoc with "trying on CPAP"      Interval palpitations while wearing monitor                                                                                                                                        Past Medical History  Diagnosis Date  . High cholesterol   . Anxiety     Past Surgical History  Procedure Laterality Date  . Neck fusion  04-03-10    4 leve   . Back surgery  07-06-10    "Drill out" surgery on lower back  . Herniated disc  01-2000    Current Outpatient Prescriptions  Medication Sig Dispense Refill  . Cholecalciferol (VITAMIN D3) 2000 UNITS TABS Take 1 tablet by mouth daily.    . citalopram (CELEXA) 10 MG tablet Take 10 mg by mouth daily.    . clopidogrel (PLAVIX) 75 MG tablet Take 75 mg by mouth daily.    . cyclobenzaprine (FLEXERIL) 10 MG tablet Take 10 mg by mouth daily.    Marland Kitchen esomeprazole (NEXIUM) 20 MG capsule Take 20 mg by mouth daily at 12 noon.    . fluvastatin XL (LESCOL XL) 80 MG 24 hr tablet Take 80 mg by mouth daily.    . Lansoprazole (PREVACID PO) Take 1 tablet by mouth daily.    Marland Kitchen levETIRAcetam (KEPPRA) 500 MG tablet Take 1 tablet (500 mg total) by mouth 2 (two) times daily. 60 tablet 6  . meloxicam (MOBIC) 15 MG tablet Take 15 mg by mouth daily.    . metoprolol succinate (TOPROL-XL) 25 MG 24 hr tablet Take 12.5 mg by mouth daily.    Marland Kitchen VITAMIN  E PO Take 2,000 Units by mouth daily.     No current facility-administered medications for this visit.    Allergies  Allergen Reactions  . Codeine Anaphylaxis  . Phenobarbital     Shaky   . Ultram [Tramadol]     Shaky     Review of Systems negative except from HPI and PMH  Physical Exam BP 138/74 mmHg  Pulse 81  Ht 5' 4.5" (1.638 m)  Wt 157 lb (71.215 kg)  BMI 26.54 kg/m2 Well developed and well nourished in no acute distress  HENT normal E scleral and icterus clear Neck Supple JVP flat; carotids brisk and full Clear to ausculation  egular rate and rhythm, no murmurs gallops or rub Soft with active bowel sounds No clubbing cyanosis { Edema Alert and oriented, grossly normal motor and sensory function Skin Warm and Dry  Event recorder reviewed  All symptoms are assoc with sinus rhtyhm  Assessment and  Plan  Palpitations  "syncope"  Dyspnea with Exertion  No evidence of cardiovascular disease  No arrhythmia with syncope supporting prior hypothesis of neurological/psychogenic events  Reviewed data with pt

## 2015-02-06 ENCOUNTER — Encounter: Payer: Self-pay | Admitting: Neurology

## 2015-02-06 ENCOUNTER — Ambulatory Visit (INDEPENDENT_AMBULATORY_CARE_PROVIDER_SITE_OTHER): Payer: Commercial Managed Care - HMO | Admitting: Neurology

## 2015-02-06 VITALS — BP 148/78 | HR 77 | Temp 97.8°F | Ht 64.5 in | Wt 158.6 lb

## 2015-02-06 DIAGNOSIS — R569 Unspecified convulsions: Secondary | ICD-10-CM

## 2015-02-06 DIAGNOSIS — H905 Unspecified sensorineural hearing loss: Secondary | ICD-10-CM

## 2015-02-06 DIAGNOSIS — H9190 Unspecified hearing loss, unspecified ear: Secondary | ICD-10-CM

## 2015-02-06 DIAGNOSIS — H8110 Benign paroxysmal vertigo, unspecified ear: Secondary | ICD-10-CM | POA: Diagnosis not present

## 2015-02-06 NOTE — Progress Notes (Signed)
[facility]    Provider:  Dr Lucia Gaskins Referring Provider: [name], NP Primary Care Physician:  [name], NP   CC: Loss of consciousness  Interval Update 02/06/2015:   She is doing well. Tolerating Keppra well. No further episodes of seizure-like activity. On the  And feels better. Headaches are better. She is having vertigo issues, spinning sensation on head turning. Dix hallpike is negative however sounds like BPPV. Has decreased hearing as well so will refer for ENT consult.   HPI: Lindsey Lopez is a 69 y.o. female here as a referral from Dr. Shayne Alken for loss of consciousness. They drove to [place], on the 19th went to the [employer]. She was standing in line for a show and she felt like she was falling backwards. She was out for 10-15 minutes, totally blacked out. Husband provides most of information. Eyes closed. Everything turned black. No abnormal movements, loss of bowel or blader. She just laid there. There was no response. She was breathing, she has no recollection of events. Gradually she began to open her eyes but couldn't respond. A nurse in the crowd helped, and the more the nurse worked with her, the more responsive she became. Right arm and right leg weren't moving as well as the left side. The right side of the face drooped. After 15 minutes she responded better, they sat her up, This is the first time, never happened before. No inciting factors. She slowly got better, was very tired afterwards, slept for 2 days. She sometimes has some word-finding difficulties otherwise back to normal. She is on aspirin now (was not before). She is on a statin. No diabetes. There were no new medications or other inciting events. EEg slowed bitemporal slowing. No personal or FHx of seizures. Never had a stroke.   Headaches are every day. Bitemporal pressure. She is very tense. Headache wake her up in the middle of the night. When she rolls on her back she starts breathing  through her mouth, significant snoring to the point she wakes herself. She is excessively tired during the day (however Epworth scale only 2). She takes naps often. She has gained 25-30 pounds recently. She has witnessed apneic events by husband. Wakes up with headaches. Daily headaches.   Reviewed notes, labs and imaging from outside physicians, which showed: MRI of the brain w/wo contrast unremarkable with some non-specific white matter periventricular increased signal, no infarct or mass or bleed, normal architecture, MRi of the cervical spine with postoperative changes with anterior cervical diskectomy and instrumentation c3-c7, solid fusion c3-c5, stenosis at c7-t1 and t1-t2 without cord flattening, no fracture or cord lesion. MRA neck w/wo: no significant stenosis interna carotid arteries or vertebral arteries. MRA head, normal. TEE with normal EF, abnormal left vent diastolic dysfunction, mild MR, trace TR, left vent nml size. Carotid duplex without hemodynamically significant stenosis in the bilat common, internal and external carotid arteries. EEG abnormal, intermittent bitemporal slpwing without interictal or epileptiform abnormalities.  Review of Systems: Patient complains of symptoms per HPI as well as the following symptoms: Patient complains of memory loss, dizziness, numbness, speech difficulty, passing out, apnea, leg swelling, ringing in ears, cough, back pain, aching muscles, muscle cramps, walking difficulty, confusion. Pertinent negatives per HPI. All others negative.   History   Social History  . Marital Status: Married    Spouse Name: [name]   . Number of Children: 1  . Years of Education: College   Occupational History  . Retired  Social History Main Topics  . Smoking status: Never Smoker   . Smokeless tobacco: Not on file  . Alcohol Use: No  . Drug Use: No  . Sexual Activity: Not on file   Other Topics Concern  . Not on file   Social History Narrative   Lives at  home with husband, Cleda ClarksRandy Basher.   She is retired.    Has one child   Caffeine use: 1 cup a day    Family History  Problem Relation Age of Onset  . Prostate cancer Brother   . Cancer Mother   . Cancer Father   . Heart attack Mother   . Heart attack Father   . Heart attack Maternal Grandmother   . Heart attack Maternal Grandfather     Past Medical History  Diagnosis Date  . High cholesterol   . Anxiety     Past Surgical History  Procedure Laterality Date  . Neck fusion  04-03-10    4 leve   . Back surgery  07-06-10    "Drill out" surgery on lower back  . Herniated disc  01-2000    Current Outpatient Prescriptions  Medication Sig Dispense Refill  . Cholecalciferol (VITAMIN D3) 2000 UNITS TABS Take 1 tablet by mouth daily.    . citalopram (CELEXA) 20 MG tablet Take 10 mg by mouth daily.    . clopidogrel (PLAVIX) 75 MG tablet Take 75 mg by mouth daily.    . cyclobenzaprine (FLEXERIL) 10 MG tablet Take 10 mg by mouth daily.    Marland Kitchen. esomeprazole (NEXIUM) 20 MG capsule Take 20 mg by mouth daily at 12 noon.    . fluvastatin XL (LESCOL XL) 80 MG 24 hr tablet Take 80 mg by mouth daily.    . Lansoprazole (PREVACID PO) Take 1 tablet by mouth daily.    Marland Kitchen. levETIRAcetam (KEPPRA) 500 MG tablet Take 1 tablet (500 mg total) by mouth 2 (two) times daily. 60 tablet 6  . meloxicam (MOBIC) 15 MG tablet Take 15 mg by mouth daily.    . metoprolol succinate (TOPROL-XL) 25 MG 24 hr tablet Take 12.5 mg by mouth daily.    Marland Kitchen. VITAMIN E PO Take 2,000 Units by mouth daily.     No current facility-administered medications for this visit.    Allergies as of 02/06/2015 - Review Complete 02/06/2015  Allergen Reaction Noted  . Codeine Anaphylaxis 10/08/2014  . Phenobarbital  10/08/2014  . Ultram [tramadol]  10/08/2014    Vitals: BP 148/78 mmHg  Pulse 77  Temp(Src) 97.8 F (36.6 C)  Ht 5' 4.5" (1.638 m)  Wt 158 lb 9.6 oz (71.94 kg)  BMI 26.81 kg/m2 Last Weight:  Wt Readings from Last 1  Encounters:  02/06/15 158 lb 9.6 oz (71.94 kg)   Last Height:   Ht Readings from Last 1 Encounters:  02/06/15 5' 4.5" (1.638 m)    Cranial Nerves:  The pupils are equal, round, and reactive to light. The fundi are normal and spontaneous venous pulsations are present. Visual fields are full to finger confrontation. Extraocular movements are intact. Trigeminal sensation is intact and the muscles of mastication are normal. The face is symmetric. The palate elevates in the midline. Hearing grossly intact to voice. Voice is normal. Shoulder shrug is normal. The tongue has normal motion without fasciculations.     Assessment/Plan: 69 year old female with loss of consciousness, right-sided weakness. She was admitted for workup, MRI of the brain w/wo contrast unremarkable, EEG abnormal inpatient (bitemporal  intermittent slowing) and EEGperformed in clinic today also abnormal (This is an abnormal EEG recording secondary to dysrhythmic theta frequency activity coming from the left temporal region, this study suggests a left brain abnormality with a lowered seizure threshold. No electrographic seizures were recorded, however.) Personally reviewed EEG and agree with findings.    Continue Keppra  bid. Advised no driving until 6 months - 1 year seizure free based on locality laws, do not bathe or swim alone or do anything that could cause harm should you have a seizure. Follow up in 3 months.   Headache - better after being on CPAP for severe OSA. Continue cpap, will set he rup with follow up in our sleep clinic  Continue ASA 81 and statin for stroke prevention. Follow closely with pcp for management of vascular risk factors. Had extensive cardiology wkup with Dr. 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 However since she has hearing loss/changes as well, will refer to ENT.  Naomie Dean, MD  [facility] [address] [place], [place]  [address]  Phone [phone] Fax [phone]  A total of 30 minutes was spent face-to-face with this patient. Over half this time was spent on counseling patient on the seizure and BPPV diagnosis and different diagnostic and therapeutic options available.

## 2015-02-06 NOTE — Patient Instructions (Signed)
Overall you are doing fairly well but I do want to suggest a few things today:   Remember to drink plenty of fluid, eat healthy meals and do not skip any meals. Try to eat protein with a every meal and eat a healthy snack such as fruit or nuts in between meals. Try to keep a regular sleep-wake schedule and try to exercise daily, particularly in the form of walking, 20-30 minutes a day, if you can.   As far as your medications are concerned, I would like to suggest: Continue current medications  I would like to see you back in 6 months, sooner if we need to. Please call us with any interim questions, concerns, problems, updates or refill requests.   Please also call us for any test results so we can go over those with you on the phone.  My clinical assistant and will answer any of your questions and relay your messages to me and also relay most of my messages to you.   Our phone number is [phone]. We also have an after hours call service for urgent matters and there is a physician on-call for urgent questions. For any emergencies you know to call 911 or go to the nearest emergency room   

## 2015-02-07 DIAGNOSIS — R569 Unspecified convulsions: Secondary | ICD-10-CM | POA: Insufficient documentation

## 2015-02-07 DIAGNOSIS — H8110 Benign paroxysmal vertigo, unspecified ear: Secondary | ICD-10-CM | POA: Insufficient documentation

## 2015-02-10 ENCOUNTER — Ambulatory Visit (INDEPENDENT_AMBULATORY_CARE_PROVIDER_SITE_OTHER): Payer: Commercial Managed Care - HMO | Admitting: Neurology

## 2015-02-10 ENCOUNTER — Encounter: Payer: Self-pay | Admitting: Neurology

## 2015-02-10 VITALS — BP 124/78 | HR 74 | Resp 20 | Ht 64.96 in | Wt 158.0 lb

## 2015-02-10 DIAGNOSIS — I63111 Cerebral infarction due to embolism of right vertebral artery: Secondary | ICD-10-CM | POA: Diagnosis not present

## 2015-02-10 DIAGNOSIS — G478 Other sleep disorders: Secondary | ICD-10-CM | POA: Diagnosis not present

## 2015-02-10 DIAGNOSIS — R51 Headache: Secondary | ICD-10-CM | POA: Diagnosis not present

## 2015-02-10 DIAGNOSIS — Z9989 Dependence on other enabling machines and devices: Secondary | ICD-10-CM

## 2015-02-10 DIAGNOSIS — R519 Headache, unspecified: Secondary | ICD-10-CM | POA: Insufficient documentation

## 2015-02-10 DIAGNOSIS — G4733 Obstructive sleep apnea (adult) (pediatric): Secondary | ICD-10-CM | POA: Insufficient documentation

## 2015-02-10 NOTE — Progress Notes (Signed)
SLEEP MEDICINE CLINIC   Provider:  Melvyn Novas, M D  Referring Provider: [name], NP Primary Care Physician:  [name], NP  Chief Complaint  Patient presents with  . Follow-up    cpap, rm 10, alone    HPI:  Lindsey Lopez is a 69 y.o. female  Is seen here as a referral/ revisit from Dr Lucia Gaskins,   My partner Naomie Dean ordered a sleep study for this patient and meanwhile 69 year old Caucasian female with a history of stroke and seizure in January 2016.  The patient also is status post cervical disc disease neck fusion. She had presented to Dr. Lucia Gaskins and Heide Guile with sleep complaints including sleep related headaches witnessed snoring and apneas and trouble maintaining sleep at night. In daytime she felt excessively sleepy and fatigued.  The patient underwent a sleep study on 2-20 4-16 and was diagnosed with a very mild apnea index of 6.3 but a higher RDI or so-called respiratory disturbance index of 18.4. 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 He stopped the she felt that the onset of symptoms was clearly related to her cervical fusion surgery. The patient also had documented 57 minutes of oxygen desaturation was in the 2 hours of sleep she had frequent periodic limb movements -but she did not 'kick" herself awake. Arousals related to the periodic limb movements were not seen.  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 And also had several bathroom breaks during her sleep study which could have been related to obstructive sleep apnea as well.   The patient was placed on an AutoSet machine, between 5 and 15 cm water pressure. An EPI level of 2 cm water was used.  The patient is here today was 97% compliance 29 out of 30  days and 27 out of those days she has used her machine for over 4 hours consecutively. This is still a compliance of 90% total the average user time is 7 hours and 8 minutes at night and the 95th percentile for pressure is 10.3 cm water. The residual AHI is 1.6 and the needs to be no adjustments made. She does have some nights higher air leaks and others which may be related to the mask fit. She just a new mask fitted- a nasal pillow for her. She is now using a dream waer mask.  She needs a reorder for this interface , small headset and interface, DREAM WEAR.    She tries to go to bed between 9 and 10 PM and usually will take about 10-15 minutes to fall asleep. She rises between 6 and 6:30 in the morning, usually spontaneous. She would have 3-4 nocturia breaks interrupting her sleep before CPAP was initiated. Now she has 0. As not woken this headaches or awoken from headaches. She had severe stabbing headaches in the middle of the night before CPAP was initiated these clusters can be provoked by CO2 retention and hypoxemia at night causing him return high blood pressures. Epworth is now 5 and FSS 24 , she feels better, her husband gets better sleep as she stopped snoring.          The patient has seen Heide Guile, FNP. [place] [facility] [place] Loss of consciousness ,  Interval Update 02/06/2015:   She is doing well. Tolerating Keppra well. No further episodes of seizure-like activity. On the  And feels better. Headaches are better. She is having vertigo issues, spinning sensation on head turning. Dix hallpike is negative however sounds like BPPV. Has decreased hearing as well so will refer for ENT consult.   HPI: Lindsey Lopez is a 68 y.o. female here as a referral from Dr. Shayne Alken for loss of consciousness. They drove to [place], [place] [employer] She was standing in line for a show and she felt like she was falling backwards. She was out for 10-15 minutes, totally blacked out. Husband provides  most of information. Eyes closed. Everything turned black. No abnormal movements, loss of bowel or blader. She just laid there. There was no response. She was breathing, she has no recollection of events. Gradually she began to open her eyes but couldn't respond. A nurse in the crowd helped, and the more the nurse worked with her, the more responsive she became. Right arm and right leg weren't moving as well as the left side. The right side of the face drooped. After 15 minutes she responded better, they sat her up, This is the first time, never happened before. No inciting factors. She slowly got better, was very tired afterwards, slept for 2 days. She sometimes has some word-finding difficulties otherwise back to normal. She is on aspirin now (was not before). She is on a statin. No diabetes. There were no new medications or other inciting events. EEg slowed bitemporal slowing. No personal or FHx of seizures. Never had a stroke.   Headaches are every day. Bitemporal pressure. She is very tense. Headache wake her up in the middle of the night. When she rolls on her back she starts breathing through her mouth, significant snoring to the point she wakes herself. She is excessively tired during the day (however Epworth scale only 2). She takes naps often. She has gained 25-30 pounds recently. She has witnessed apneic events by husband. Wakes up with headaches. Daily headaches.   Reviewed notes, labs and imaging from outside physicians, which showed: MRI of the brain w/wo contrast unremarkable with some non-specific white matter periventricular increased signal, no infarct or mass or bleed, normal architecture, MRi of the cervical spine with postoperative changes with anterior cervical diskectomy and instrumentation c3-c7, solid fusion c3-c5, stenosis at c7-t1 and t1-t2 without cord flattening, no fracture or cord lesion. MRA neck w/wo: no significant stenosis interna carotid arteries or vertebral arteries. MRA  head, normal. TEE with normal EF, abnormal left vent diastolic dysfunction, mild MR, trace TR, left vent nml size. Carotid duplex without hemodynamically significant stenosis in the bilat common, internal and external carotid arteries. EEG abnormal, intermittent bitemporal slpwing without interictal or epileptiform abnormalities.    Review of Systems: Out of a complete 14 system review, the patient complains of only the following symptoms, and all other reviewed systems are negative.   Epworth score 5 , Fatigue severity score 27   , depression score 1    History   Social History  . Marital Status: Married    Spouse Name: [name]   . Number of Children: 1  . Years of Education: College   Occupational History  . Retired    Social History Main Topics  . Smoking status: Never Smoker   . Smokeless tobacco: Not on file  . Alcohol Use: No  . Drug Use: No  . Sexual Activity: Not  on file   Other Topics Concern  . Not on file   Social History Narrative   Lives at home with husband, Marcina Kinnison.   She is retired.    Has one child   Caffeine use: 1 cup a day    Family History  Problem Relation Age of Onset  . Prostate cancer Brother   . Cancer Mother   . Cancer Father   . Heart attack Mother   . Heart attack Father   . Heart attack Maternal Grandmother   . Heart attack Maternal Grandfather     Past Medical History  Diagnosis Date  . High cholesterol   . Anxiety     Past Surgical History  Procedure Laterality Date  . Neck fusion  04-03-10    4 leve   . Back surgery  07-06-10    "Drill out" surgery on lower back  . Herniated disc  01-2000    Current Outpatient Prescriptions  Medication Sig Dispense Refill  . Cholecalciferol (VITAMIN D3) 2000 UNITS TABS Take 1 tablet by mouth daily.    . citalopram (CELEXA) 20 MG tablet Take 10 mg by mouth daily.    . cyclobenzaprine (FLEXERIL) 10 MG tablet Take 10 mg by mouth daily.    Marland Kitchen esomeprazole (NEXIUM) 20 MG capsule Take 20  mg by mouth daily at 12 noon.    . fluvastatin XL (LESCOL XL) 80 MG 24 hr tablet Take 80 mg by mouth daily.    . Lansoprazole (PREVACID PO) Take 1 tablet by mouth daily.    Marland Kitchen levETIRAcetam (KEPPRA) 500 MG tablet Take 1 tablet (500 mg total) by mouth 2 (two) times daily. 60 tablet 6  . meloxicam (MOBIC) 15 MG tablet Take 15 mg by mouth daily.    . metoprolol succinate (TOPROL-XL) 25 MG 24 hr tablet Take 12.5 mg by mouth daily.    Marland Kitchen VITAMIN E PO Take 2,000 Units by mouth daily.     No current facility-administered medications for this visit.    Allergies as of 02/10/2015 - Review Complete 02/10/2015  Allergen Reaction Noted  . Codeine Anaphylaxis 10/08/2014  . Phenobarbital  10/08/2014  . Ultram [tramadol]  10/08/2014    Vitals: BP 124/78 mmHg  Pulse 74  Resp 20  Ht 5' 4.96" (1.65 m)  Wt 158 lb (71.668 kg)  BMI 26.32 kg/m2 Last Weight:  Wt Readings from Last 1 Encounters:  02/10/15 158 lb (71.668 kg)       Last Height:   Ht Readings from Last 1 Encounters:  02/10/15 5' 4.96" (1.65 m)    Physical exam:  General: The patient is awake, alert and appears not in acute distress. The patient is well groomed. Head: Normocephalic, atraumatic. Neck is supple. Mallampati 3   neck circumference: 154 . Nasal airflow unrestricted , TMJ is not  evident . Retrognathia is not seen.  Cardiovascular:  Regular rate and rhythm, without  murmurs or carotid bruit, and without distended neck veins. Respiratory: Lungs are clear to auscultation. Skin:  Without evidence of edema, or rash Trunk: BMI is elevated and patient  has normal posture.  Neurologic exam : The patient is awake and alert, oriented to place and time.   Memory subjective described as intact.  There is a normal attention span & concentration ability. Speech is fluent without dysarthria, dysphonia or aphasia. Mood and affect are appropriate. Cranial nerves: Pupils are equal and briskly reactive to light. Funduscopic exam without    evidence of pallor or edema.  Extraocular movements  in vertical and horizontal planes intact and without nystagmus. Visual fields by finger perimetry are intact. Hearing to finger rub intact.  Facial sensation intact to fine touch. Facial motor strength is symmetric and tongue and uvula move midline. Motor exam:  Normal tone, muscle bulk and symmetric strength in all extremities. Sensory:  Fine touch, pinprick and vibration were tested in all extremities. Proprioception is  normal. Coordination: Rapid alternating movements in the fingers/hands is normal.  Finger-to-nose maneuver normal without evidence of ataxia, dysmetria or tremor. Gait and station: Patient walks without assistive device and is able unassisted to climb up to the exam table.  Strength within normal limits. Stance is stable and normal. Tandem gait is unfragmented. Romberg testing is negative.  Deep tendon reflexes: in the  upper and lower extremities are symmetric and intact. Babinski maneuver response is  downgoing.   Assessment:  After physical and neurologic examination, review of laboratory studies, imaging, neurophysiology testing and pre-existing records, assessment is   1) OSA and UARS,  following seizure  and stroke in January 2016 , treated with TPA.  2) patient begun snoring and having apnea following cervical fusion.  3) complete recovery.   The patient was advised of the nature of the diagnosed sleep disorder , the treatment options and risks for general a health and wellness arising from not treating the condition. Visit duration was 30 minutes.   Plan:  Treatment plan and additional workup :  Continue use of auto set CPAP, patient changed to DREAM WEAR interface in small size. Rv once a year in sleep clinic.       Porfirio Mylararmen Nickolette Espinola MD  02/10/2015

## 2015-05-03 ENCOUNTER — Other Ambulatory Visit: Payer: Self-pay | Admitting: Neurology

## 2015-08-11 ENCOUNTER — Encounter: Payer: Self-pay | Admitting: Neurology

## 2015-08-11 ENCOUNTER — Ambulatory Visit (INDEPENDENT_AMBULATORY_CARE_PROVIDER_SITE_OTHER): Payer: Commercial Managed Care - HMO | Admitting: Neurology

## 2015-08-11 VITALS — BP 116/69 | HR 86 | Wt 158.2 lb

## 2015-08-11 DIAGNOSIS — R569 Unspecified convulsions: Secondary | ICD-10-CM

## 2015-08-11 DIAGNOSIS — F410 Panic disorder [episodic paroxysmal anxiety] without agoraphobia: Secondary | ICD-10-CM

## 2015-08-11 DIAGNOSIS — R413 Other amnesia: Secondary | ICD-10-CM | POA: Diagnosis not present

## 2015-08-11 DIAGNOSIS — R5383 Other fatigue: Secondary | ICD-10-CM

## 2015-08-11 MED ORDER — DIAZEPAM 2 MG PO TABS: 2.0000 mg | ORAL_TABLET | Freq: Four times a day (QID) | ORAL | Status: DC | PRN

## 2015-08-11 MED ORDER — LEVETIRACETAM 500 MG PO TABS: 500.0000 mg | ORAL_TABLET | Freq: Two times a day (BID) | ORAL | Status: DC

## 2015-08-11 NOTE — Patient Instructions (Signed)
Overall you are doing fairly well but I do want to suggest a few things today:   Remember to drink plenty of fluid, eat healthy meals and do not skip any meals. Try to eat protein with a every meal and eat a healthy snack such as fruit or nuts in between meals. Try to keep a regular sleep-wake schedule and try to exercise daily, particularly in the form of walking, 20-30 minutes a day, if you can.   As far as your medications are concerned, I would like to suggest: Diazepam at onset of panic attack. May repeat if needed.   I would like to see you back in 6 months, sooner if we need to. Please call us with any interim questions, concerns, problems, updates or refill requests.    Our phone number is [phone]. We also have an after hours call service for urgent matters and there is a physician on-call for urgent questions. For any emergencies you know to call 911 or go to the nearest emergency room

## 2015-08-11 NOTE — Progress Notes (Signed)
[facility]    Provider:  Dr Lucia GaskinsAhern Referring Provider: Ronal FearLam, Lynn E, NP Primary Care Physician:  Ronal FearLam, Lynn E, NP  CC: Loss of consciousness  Interval update 08/11/2015: She has some "episodes" since being seen. She was overwhelmed by her son's wedding and by the end of the night she couldn't think, she had to leave, going home she kept asking questions about the wedding and she couldn't remember, she gets disoriented. She did eventually remember everything but she needs to be careful not to overdue it. She remembers telling her grandson she felt lost, no altered mentation. She naps daily. This is new, started in June. She has been fine on the cpap. She is due in June for follow up with Dr. Vickey Hugerohmeier. [name] has had a few panic attacks. Not very often. [facility] went [facility] [place] and she had a pamnic attack, they had to lay her down on the floor and stop the ride. She has had two this year. The panic attack can last   Interval Update 02/06/2015:   She is doing well. Tolerating Keppra well. No further episodes of seizure-like activity. On the And feels better. Headaches are better. She is having vertigo issues, spinning sensation on head turning. Dix hallpike is negative however sounds like BPPV. Has decreased hearing as well so will refer for ENT consult.   HPI: Lindsey Lopez is a 69 y.o. female here as a referral from Dr. Shayne AlkenLam for loss of consciousness. [place], [place] [employer] She was standing in line for a show and she felt like she was falling backwards. She was out for 10-15 minutes, totally blacked out. Husband provides most of information. Eyes closed. Everything turned black. No abnormal movements, loss of bowel or blader. She just laid there. There was no response. She was breathing, she has no recollection of events. Gradually she began to open her eyes but couldn't respond. A nurse in the crowd helped, and the more the nurse worked with her,  the more responsive she became. Right arm and right leg weren't moving as well as the left side. The right side of the face drooped. After 15 minutes she responded better, they sat her up, This is the first time, never happened before. No inciting factors. She slowly got better, was very tired afterwards, slept for 2 days. She sometimes has some word-finding difficulties otherwise back to normal. She is on aspirin now (was not before). She is on a statin. No diabetes. There were no new medications or other inciting events. EEg slowed bitemporal slowing. No personal or FHx of seizures. Never had a stroke.   Headaches are every day. Bitemporal pressure. She is very tense. Headache wake her up in the middle of the night. When she rolls on her back she starts breathing through her mouth, significant snoring to the point she wakes herself. She is excessively tired during the day (however Epworth scale only 2). She takes naps often. She has gained 25-30 pounds recently. She has witnessed apneic events by husband. Wakes up with headaches. Daily headaches.   Reviewed notes, labs and imaging from outside physicians, which showed: MRI of the brain w/wo contrast unremarkable with some non-specific white matter periventricular increased signal, no infarct or mass or bleed, normal architecture, MRi of the cervical spine with postoperative changes with anterior cervical diskectomy and instrumentation c3-c7, solid fusion c3-c5, stenosis at c7-t1 and t1-t2 without cord flattening, no fracture or cord lesion. MRA neck  w/wo: no significant stenosis interna carotid arteries or vertebral arteries. MRA head, normal. TEE with normal EF, abnormal left vent diastolic dysfunction, mild MR, trace TR, left vent nml size. Carotid duplex without hemodynamically significant stenosis in the bilat common, internal and external carotid arteries. EEG abnormal, intermittent bitemporal slpwing without interictal or epileptiform abnormalities.    Review of Systems: Patient complains of symptoms per HPI as well as the following symptoms: Ringing in ears, excessive eating, daytime sleepiness, back pain, muscle cramps, itching, dizziness, headache. Pertinent negatives per HPI. All others negative.   Social History   Social History  . Marital Status: Married    Spouse Name: [name]   . Number of Children: 1  . Years of Education: College   Occupational History  . Retired    Social History Main Topics  . Smoking status: Never Smoker   . Smokeless tobacco: Not on file  . Alcohol Use: No  . Drug Use: No  . Sexual Activity: Not on file   Other Topics Concern  . Not on file   Social History Narrative   Lives at home with husband, Delvina Mizzell.   She is retired.    Has one child   Caffeine use: 1 cup a day    Family History  Problem Relation Age of Onset  . Prostate cancer Brother   . Cancer Mother   . Cancer Father   . Heart attack Mother   . Heart attack Father   . Heart attack Maternal Grandmother   . Heart attack Maternal Grandfather     Past Medical History  Diagnosis Date  . High cholesterol   . Anxiety     Past Surgical History  Procedure Laterality Date  . Neck fusion  04-03-10    4 leve   . Back surgery  07-06-10    "Drill out" surgery on lower back  . Herniated disc  01-2000    Current Outpatient Prescriptions  Medication Sig Dispense Refill  . amitriptyline (ELAVIL) 25 MG tablet Take 25 mg by mouth as needed.    . Cholecalciferol (VITAMIN D3) 2000 UNITS TABS Take 1 tablet by mouth daily.    . citalopram (CELEXA) 20 MG tablet Take 10 mg by mouth daily.    . Coenzyme Q10 (CO Q-10) 100 MG CAPS Take 1 tablet by mouth daily.    . cyclobenzaprine (FLEXERIL) 10 MG tablet Take 10 mg by mouth daily.    Marland Kitchen esomeprazole (NEXIUM) 20 MG capsule Take 20 mg by mouth daily at 12 noon.    . fluvastatin XL (LESCOL XL) 80 MG 24 hr tablet Take 80 mg by mouth daily.    . Lansoprazole (PREVACID PO) Take 1 tablet  by mouth daily.    Marland Kitchen levETIRAcetam (KEPPRA) 500 MG tablet TAKE 1 TABLET TWICE DAILY 180 tablet 1  . meloxicam (MOBIC) 15 MG tablet Take 15 mg by mouth daily.    . metoprolol succinate (TOPROL-XL) 25 MG 24 hr tablet Take 12.5 mg by mouth daily.    Marland Kitchen triamcinolone cream (KENALOG) 0.1 %     . VITAMIN E PO Take 2,000 Units by mouth daily.     No current facility-administered medications for this visit.    Allergies as of 08/11/2015 - Review Complete 02/10/2015  Allergen Reaction Noted  . Codeine Anaphylaxis 10/08/2014  . Phenobarbital  10/08/2014  . Ultram [tramadol]  10/08/2014    Vitals: BP 116/69 mmHg  Pulse 86  Wt 158 lb 3.2 oz (71.759 kg) Last Weight:  Wt Readings from Last 1 Encounters:  08/11/15 158 lb 3.2 oz (71.759 kg)   Last Height:   Ht Readings from Last 1 Encounters:  02/10/15 5' 4.96" (1.65 m)    MS:   Alert and oriented to name, date, location, situation. MoCA 30/30  Cranial Nerves:  The pupils are equal, round, and reactive to light. The fundi are normal and spontaneous venous pulsations are present. Visual fields are full to finger confrontation. Extraocular movements are intact. Trigeminal sensation is intact and the muscles of mastication are normal. The face is symmetric. The palate elevates in the midline. Hearing grossly intact to voice. Voice is normal. Shoulder shrug is normal. The tongue has normal motion without fasciculations.   Motor: 5/5  Assessment/Plan: 69 year old female with loss of consciousness, right-sided weakness, migraines, panic attacks, anxiety, fatigue. 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 No  electrographic seizures were recorded, however.) Personally reviewed EEG and agree with findings. Will continue AED.   Seizure: Continue Keppra  bid. Advised no driving until 6 months - 1 year seizure free based on locality laws, do not bathe or swim alone or do anything that could cause harm should you have a seizure. Follow up in 3 months.   Headache - better after being on CPAP for severe OSA. Continue cpap. Keppa helps.  Continue ASA 81 and statin for stroke prevention. Follow closely with pcp for management of vascular risk factors. Had extensive cardiology wkup with Dr. Graciela Husbands  Panic attacks: Diazepam  as needed  Fatigue: CMP, CBC, TSH  Memory loss: MoCA 30/30, no evidence for memory impairment.  CC:Lyn Lam   Naomie Dean, MD  [facility] [address] [place], [place] [address]  Phone [phone] Fax [phone]  A total of 45 minutes was spent face-to-face with this patient. Over half this time was spent on counseling patient on the seizure, migraine, panic attack, fatigue diagnosis and different diagnostic and therapeutic options available.

## 2015-08-12 LAB — THYROID PANEL WITH TSH
Free Thyroxine Index: 2.1 (ref 1.2–4.9)
T3 UPTAKE RATIO: 28 % (ref 24–39)
T4, Total: 7.4 ug/dL (ref 4.5–12.0)
TSH: 1.17 u[IU]/mL (ref 0.450–4.500)

## 2015-08-12 LAB — COMPREHENSIVE METABOLIC PANEL
A/G RATIO: 1.7 (ref 1.1–2.5)
ALBUMIN: 4.3 g/dL (ref 3.6–4.8)
ALT: 27 IU/L (ref 0–32)
AST: 23 IU/L (ref 0–40)
Alkaline Phosphatase: 154 IU/L — ABNORMAL HIGH (ref 39–117)
BILIRUBIN TOTAL: 0.3 mg/dL (ref 0.0–1.2)
BUN / CREAT RATIO: 9 — AB (ref 11–26)
BUN: 8 mg/dL (ref 8–27)
CALCIUM: 9.1 mg/dL (ref 8.7–10.3)
CHLORIDE: 102 mmol/L (ref 97–106)
CO2: 28 mmol/L (ref 18–29)
Creatinine, Ser: 0.87 mg/dL (ref 0.57–1.00)
GFR, EST AFRICAN AMERICAN: 79 mL/min/{1.73_m2} (ref >59)
GFR, EST NON AFRICAN AMERICAN: 69 mL/min/{1.73_m2} (ref >59)
GLOBULIN, TOTAL: 2.5 g/dL (ref 1.5–4.5)
Glucose: 89 mg/dL (ref 65–99)
POTASSIUM: 4.7 mmol/L (ref 3.5–5.2)
SODIUM: 142 mmol/L (ref 136–144)
TOTAL PROTEIN: 6.8 g/dL (ref 6.0–8.5)

## 2015-08-12 LAB — CBC
Hematocrit: 38.2 % (ref 34.0–46.6)
Hemoglobin: 12.5 g/dL (ref 11.1–15.9)
MCH: 26 pg — AB (ref 26.6–33.0)
MCHC: 32.7 g/dL (ref 31.5–35.7)
MCV: 80 fL (ref 79–97)
PLATELETS: 276 10*3/uL (ref 150–379)
RBC: 4.8 x10E6/uL (ref 3.77–5.28)
RDW: 16.2 % — AB (ref 12.3–15.4)
WBC: 7 10*3/uL (ref 3.4–10.8)

## 2015-08-13 ENCOUNTER — Telehealth: Payer: Self-pay | Admitting: *Deleted

## 2015-08-13 NOTE — Telephone Encounter (Signed)
-----   Message from Anson FretAntonia B Ahern, MD sent at 08/12/2015  7:32 PM EST ----- Labs were unremarkable thanks

## 2015-08-13 NOTE — Telephone Encounter (Signed)
LVM for pt to call back about lab results. Ok to inform pt labs unremarkable per Dr Lucia GaskinsAhern.

## 2015-08-21 NOTE — Telephone Encounter (Signed)
Pt returned call. She was told about lab work.

## 2015-08-21 NOTE — Telephone Encounter (Signed)
LVM to have pt call about results. OK to let pt know labs unremarkable per Dr Lucia GaskinsAhern.

## 2015-09-10 ENCOUNTER — Other Ambulatory Visit: Payer: Self-pay | Admitting: Neurology

## 2015-09-19 ENCOUNTER — Other Ambulatory Visit: Payer: Self-pay | Admitting: Nurse Practitioner

## 2015-09-19 DIAGNOSIS — Z1231 Encounter for screening mammogram for malignant neoplasm of breast: Secondary | ICD-10-CM

## 2015-09-19 DIAGNOSIS — Z780 Asymptomatic menopausal state: Secondary | ICD-10-CM

## 2015-10-14 ENCOUNTER — Other Ambulatory Visit: Payer: Self-pay | Admitting: Nurse Practitioner

## 2015-10-14 DIAGNOSIS — Z780 Asymptomatic menopausal state: Secondary | ICD-10-CM

## 2015-10-16 ENCOUNTER — Ambulatory Visit
Admission: RE | Admit: 2015-10-16 | Discharge: 2015-10-16 | Disposition: A | Discharge status: Home / Self Care | Payer: Commercial Managed Care - HMO | Source: Ambulatory Visit | Attending: Nurse Practitioner | Admitting: Nurse Practitioner

## 2015-10-16 DIAGNOSIS — Z1231 Encounter for screening mammogram for malignant neoplasm of breast: Secondary | ICD-10-CM

## 2015-10-16 DIAGNOSIS — Z780 Asymptomatic menopausal state: Secondary | ICD-10-CM

## 2016-02-10 ENCOUNTER — Encounter: Payer: Self-pay | Admitting: Neurology

## 2016-02-10 ENCOUNTER — Ambulatory Visit (INDEPENDENT_AMBULATORY_CARE_PROVIDER_SITE_OTHER): Payer: Commercial Managed Care - HMO | Admitting: Neurology

## 2016-02-10 VITALS — BP 122/72 | HR 82 | Resp 20 | Ht 64.0 in | Wt 153.0 lb

## 2016-02-10 DIAGNOSIS — I69398 Other sequelae of cerebral infarction: Secondary | ICD-10-CM

## 2016-02-10 DIAGNOSIS — G4733 Obstructive sleep apnea (adult) (pediatric): Secondary | ICD-10-CM | POA: Diagnosis not present

## 2016-02-10 DIAGNOSIS — Z9989 Dependence on other enabling machines and devices: Principal | ICD-10-CM

## 2016-02-10 DIAGNOSIS — R569 Unspecified convulsions: Secondary | ICD-10-CM

## 2016-02-10 NOTE — Progress Notes (Signed)
SLEEP MEDICINE CLINIC   Provider:  Melvyn Novasarmen  Maksim Peregoy, M D  Referring Provider: Ronal FearLam, Lynn E, NP Primary Care Physician:  Ronal FearLam, Lynn E, NP  Chief Complaint  Patient presents with  . Follow-up    cpap, going well, sometimes wakes up choking for breath, rm 11, alone    HPI:  Lindsey Lopez is a 70 y.o. female  Is seen here as a referral/ revisit from Dr Lucia GaskinsAhern,   My partner  Dr. Naomie DeanAntonia Ahern ordered a sleep study for this stroke and seizure patient, a meanwhile 70 year old Caucasian female with a history of stroke and seizure in January 2016.  The patient also is status post cervical disc disease neck fusion. She had presented to Dr. Lucia GaskinsAhern and Heide GuileLynn Lam with sleep complaints including sleep related headaches witnessed snoring and apneas and trouble maintaining sleep at night. In daytime she felt excessively sleepy and fatigued.  The patient underwent a sleep study on 2-20 4-16 and was diagnosed with a very mild apnea index of 6.3 but a higher RDI or so-called respiratory disturbance index of 18.4. 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 He stopped the she felt that the onset of symptoms was clearly related to her cervical fusion surgery. The patient also had documented 57 minutes of oxygen desaturation was in the 2 hours of sleep she had frequent periodic limb movements -but she did not 'kick" herself awake. Arousals related to the periodic limb movements were not seen.  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 And also had several bathroom breaks during her sleep study which could have been related to obstructive sleep apnea as well.   The patient was placed on an AutoSet machine, between 5 and 15 cm water pressure. An EPI level of 2 cm water  was used.  The patient is here today was 97% compliance 29 out of 30 days and 27 out of those days she has used her machine for over 4 hours consecutively. This is still a compliance of 90% total the average user time is 7 hours and 8 minutes at night and the 95th percentile for pressure is 10.3 cm water. The residual AHI is 1.6 and the needs to be no adjustments made. She does have some nights higher air leaks and others which may be related to the mask fit. She just a new mask fitted- a nasal pillow for her. She is now using a dream waer mask.  She needs a reorder for this interface , small headset and interface, DREAM WEAR.    She tries to go to bed between 9 and 10 PM and usually will take about 10-15 minutes to fall asleep. She rises between 6 and 6:30 in the morning, usually spontaneous. She would have 3-4 nocturia breaks interrupting her sleep before CPAP was initiated. Now she has 0. As not woken this headaches or awoken from headaches. She had severe stabbing headaches in the middle of the night before CPAP was initiated these clusters can be provoked by CO2 retention and hypoxemia at night causing him return high blood pressures. Epworth is now 5 and FSS 24 , she feels better, her husband gets better sleep as she stopped snoring.   Interval history ; sixth of June 2017. I  had the pleasure of seeing [name] here again in from now on for a yearly revisit. She has been a CPAP user but has not managed to use the machine 4 hours on average at night current user time is 3 hours and 33 minutes per night, with a 40% compliance for 12 days. The patient uses an AutoSet between 5 and 15 cm water and 2 cm EPR. Residual AHI is 1.1 she does have significant air leaks intermittently but her pressure supplied is 10.3 cm for 95th percent of the time. The machine does work for her. She endorsed the Epworth sleepiness score at 1 point only. My concern is that she needs to use the machine longer per day. She  is using a below the nose interface, needs a small headset. She had some trouble finding the right size and that may have contributed to nights where she didn't feel comfortable using it over up with choking for air. She orders her supply from the Internet, [employer].  The patient has seen Heide Guile, FNP. [place] [facility] [place] Loss of consciousness , Interval Update 02/06/2015:   She is doing well. Tolerating Keppra well. No further episodes of seizure-like activity. On the  And feels better. Headaches are better. She is having vertigo issues, spinning sensation on head turning. Dix hallpike is negative however sounds like BPPV. Has decreased hearing as well so will refer for ENT consult.   HPI: Lindsey Lopez is a 70 y.o. female here as a referral from NP Heide Guile for loss of consciousness.  They drove to [place], [place] [employer] She was standing in line for a show and she felt like she was falling backwards. She was out for 10-15 minutes, totally blacked out. Husband provides most of information. Eyes closed. Everything turned black. No abnormal movements, loss of bowel or blader. She just laid there. There was no response. She was breathing, she has no recollection of events. Gradually she began to open her eyes but couldn't respond. A nurse in the crowd helped, and the more the nurse worked with her, the more responsive she became. Right arm and right leg weren't moving as well as the left side. The right side of the face drooped. After 15 minutes she responded better, they sat her up, This is the first time, never happened before. No inciting factors. She slowly got better, was very tired afterwards, slept for 2 days. She sometimes has some word-finding difficulties otherwise back to normal. She is on aspirin now (was not before). She is on a statin. No diabetes. There were no new medications or other inciting events. EEg slowed bitemporal slowing. No personal or FHx of seizures. Never had a  stroke.  Headaches are every day. Bitemporal pressure. She is very tense. Headache wake her up in the middle of the night. When she rolls on her back she starts breathing through her mouth, significant snoring to the point she wakes herself. She is excessively tired during the day (however Epworth scale only 2). She takes naps often. She has gained 25-30 pounds recently. She has witnessed apneic events by husband. Wakes up with headaches. Daily headaches.  Reviewed notes, labs and imaging from outside physicians, which showed: MRI of the brain w/wo contrast unremarkable with some non-specific white matter periventricular increased signal, no infarct or mass or bleed, normal architecture, MRi of the cervical spine with postoperative changes with anterior cervical diskectomy and instrumentation c3-c7, solid fusion c3-c5, stenosis at c7-t1  and t1-t2 without cord flattening, no fracture or cord lesion. MRA neck w/wo: no significant stenosis interna carotid arteries or vertebral arteries. MRA head, normal. TEE with normal EF, abnormal left vent diastolic dysfunction, mild MR, trace TR, left vent nml size. Carotid duplex without hemodynamically significant stenosis in the bilat common, internal and external carotid arteries. EEG abnormal, intermittent bitemporal slpwing without interictal or epileptiform abnormalities.   Review of Systems: None   Social History   Social History  . Marital Status: Married    Spouse Name: [name]   . Number of Children: 1  . Years of Education: College   Occupational History  . Retired    Social History Main Topics  . Smoking status: Never Smoker   . Smokeless tobacco: Not on file  . Alcohol Use: No  . Drug Use: No  . Sexual Activity: Not on file   Other Topics Concern  . Not on file   Social History Narrative   Lives at home with husband, Danyah Guastella.   She is retired.    Has one child   Caffeine use: 1 cup a day    Family History  Problem Relation Age of  Onset  . Prostate cancer Brother   . Cancer Mother   . Cancer Father   . Heart attack Mother   . Heart attack Father   . Heart attack Maternal Grandmother   . Heart attack Maternal Grandfather     Past Medical History  Diagnosis Date  . High cholesterol   . Anxiety     Past Surgical History  Procedure Laterality Date  . Neck fusion  04-03-10    4 leve   . Back surgery  07-06-10    "Drill out" surgery on lower back  . Herniated disc  01-2000    Current Outpatient Prescriptions  Medication Sig Dispense Refill  . amitriptyline (ELAVIL) 25 MG tablet Take 25 mg by mouth as needed.    . Calcium-Magnesium-Vitamin D (CALCIUM 1200+D3 PO) Take by mouth.    . citalopram (CELEXA) 20 MG tablet Take 10 mg by mouth daily.    . Coenzyme Q10 (CO Q-10) 100 MG CAPS Take 1 tablet by mouth daily.    . cyclobenzaprine (FLEXERIL) 10 MG tablet Take 10 mg by mouth daily.    Marland Kitchen esomeprazole (NEXIUM) 20 MG capsule Take 20 mg by mouth daily at 12 noon.    . fluvastatin XL (LESCOL XL) 80 MG 24 hr tablet Take 80 mg by mouth daily.    . Lansoprazole (PREVACID PO) Take 1 tablet by mouth daily.    Marland Kitchen levETIRAcetam (KEPPRA) 500 MG tablet TAKE 1 TABLET TWICE DAILY 180 tablet 3  . meloxicam (MOBIC) 15 MG tablet Take 15 mg by mouth daily.    . metoprolol succinate (TOPROL-XL) 25 MG 24 hr tablet Take 12.5 mg by mouth daily.    Marland Kitchen triamcinolone cream (KENALOG) 0.1 %      No current facility-administered medications for this visit.    Allergies as of 02/10/2016 - Review Complete 02/10/2016  Allergen Reaction Noted  . Codeine Anaphylaxis 10/08/2014  . Phenobarbital  10/08/2014  . Ultram [tramadol]  10/08/2014    Vitals: BP 122/72 mmHg  Pulse 82  Resp 20  Ht 5\' 4"  (1.626 m)  Wt 153 lb (69.4 kg)  BMI 26.25 kg/m2 Last Weight:  Wt Readings from Last 1 Encounters:  02/10/16 153 lb (69.4 kg)       Last Height:   Ht Readings from Last  1 Encounters:  02/10/16 5\' 4"  (1.626 m)    Physical exam:  General:  The patient is awake, alert and appears not in acute distress. The patient is well groomed. Head: Normocephalic, atraumatic. Neck is supple. Mallampati 3   neck circumference: 154 . Nasal airflow unrestricted , TMJ is not  evident . Retrognathia is not seen.  Cardiovascular:  Regular rate and rhythm, without  murmurs or carotid bruit, and without distended neck veins. Respiratory: Lungs are clear to auscultation. Skin:  Without evidence of edema, or rash Trunk: BMI is elevated and patient  has normal posture.  Neurologic exam : The patient is awake and alert, oriented to place and time.   Memory subjective described as intact.  There is a normal attention span & concentration ability. Speech is fluent without dysarthria, dysphonia or aphasia. Mood and affect are appropriate. Cranial nerves: Pupils are equal and briskly reactive to light. Funduscopic exam without   evidence of pallor or edema. Extraocular movements  in vertical and horizontal planes intact and without nystagmus. Visual fields by finger perimetry are intact. Hearing to finger rub intact.  Facial sensation intact to fine touch. Facial motor strength is symmetric and tongue and uvula move midline.  Assessment:  After physical and neurologic examination, review of laboratory studies, imaging, neurophysiology testing and pre-existing records, assessment is   1) OSA and UARS,  following seizure  and stroke in January 2016 , treated with TPA. CPAP user, but not fully compliant.  ( patient begun snoring and having apnea following cervical fusion).   The patient was advised of the nature of the diagnosed sleep disorder , the treatment options and risks for general a health and wellness arising from not treating the condition. Visit duration was 30 minutes.   Plan:  Treatment plan and additional workup :  Continue use of auto set CPAP, patient changed to DREAM WEAR interface in small size. Rv once a year in sleep clinic.  Needs  prescription for internet dispensary.     Malikai Gut, MD  Perlie Gold Viktoriya Glaspy MD  02/10/2016

## 2016-02-10 NOTE — Patient Instructions (Signed)

## 2016-08-10 ENCOUNTER — Ambulatory Visit (INDEPENDENT_AMBULATORY_CARE_PROVIDER_SITE_OTHER): Payer: Commercial Managed Care - HMO | Admitting: Neurology

## 2016-08-10 ENCOUNTER — Encounter: Payer: Self-pay | Admitting: Neurology

## 2016-08-10 VITALS — BP 121/68 | HR 80 | Ht 64.0 in | Wt 163.6 lb

## 2016-08-10 DIAGNOSIS — R9401 Abnormal electroencephalogram [EEG]: Secondary | ICD-10-CM | POA: Diagnosis not present

## 2016-08-10 DIAGNOSIS — G44209 Tension-type headache, unspecified, not intractable: Secondary | ICD-10-CM

## 2016-08-10 DIAGNOSIS — R569 Unspecified convulsions: Secondary | ICD-10-CM

## 2016-08-10 MED ORDER — LEVETIRACETAM 500 MG PO TABS: 500.0000 mg | ORAL_TABLET | Freq: Two times a day (BID) | ORAL | 6 refills | Status: DC

## 2016-08-10 NOTE — Progress Notes (Signed)
[facility]    Provider:  Dr Lucia Gaskins Referring Provider: [name], NP Primary Care Physician:  [name], NP   CC: Loss of consciousness  Interval history 08/10/2016: She has headaches, bilateral temples and back of the head, pounding and throbbing, some nausea, she doesn't take anything for the headache. She has headaches 2x a week, they are caused by stress. Headaches started in may when mother moved in She is using her cpap regularly now, they changed the mask and has the pillow and she wears it every night. No side effects from the Keppra. Discussed stress management, can take ocassional OTC medication for the headaches. Do not sound migrainous. No light or sound sensitivity.   Interval update 08/11/2015: She has some "episodes" since being seen. She was overwhelmed by her son's wedding and by the end of the night she couldn't think, she had to leave, going home she kept asking questions about the wedding and she couldn't remember, she gets disoriented. She did eventually remember everything but she needs to be careful not to overdue it. She remembers telling her grandson she felt lost, no altered mentation. She naps daily. This is new, started in June. She has been fine on the cpap. She is due in June for follow up with Dr. Vickey Huger. [name] has had a few panic attacks. Not very often. They went to [facility] and she had a pamnic attack, they had to lay her down on the floor and stop the ride. She has had two this year. The panic attack can last   Interval Update 02/06/2015:   She is doing well. Tolerating Keppra well. No further episodes of seizure-like activity. On the And feels better. Headaches are better. She is having vertigo issues, spinning sensation on head turning. Dix hallpike is negative however sounds like BPPV. Has decreased hearing as well so will refer for ENT consult.   HPI: Lindsey Lopez is a 70 y.o. female here as a referral from Dr. Shayne Alken for loss of  consciousness. They drove to [place], [place] [facility] [employer] She was standing in line for a show and she felt like she was falling backwards. She was out for 10-15 minutes, totally blacked out. Husband provides most of information. Eyes closed. Everything turned black. No abnormal movements, loss of bowel or blader. She just laid there. There was no response. She was breathing, she has no recollection of events. Gradually she began to open her eyes but couldn't respond. A nurse in the crowd helped, and the more the nurse worked with her, the more responsive she became. Right arm and right leg weren't moving as well as the left side. The right side of the face drooped. After 15 minutes she responded better, they sat her up, This is the first time, never happened before. No inciting factors. She slowly got better, was very tired afterwards, slept for 2 days. She sometimes has some word-finding difficulties otherwise back to normal. She is on aspirin now (was not before). She is on a statin. No diabetes. There were no new medications or other inciting events. EEg slowed bitemporal slowing. No personal or FHx of seizures. Never had a stroke.   Headaches are every day. Bitemporal pressure. She is very tense. Headache wake her up in the middle of the night. When she rolls on her back she starts breathing through her mouth, significant snoring to the point she wakes herself. She is excessively tired during the day (however Epworth  scale only 2). She takes naps often. She has gained 25-30 pounds recently. She has witnessed apneic events by husband. Wakes up with headaches. Daily headaches.   Reviewed notes, labs and imaging from outside physicians, which showed: MRI of the brain w/wo contrast unremarkable with some non-specific white matter periventricular increased signal, no infarct or mass or bleed, normal architecture, MRi of the cervical spine with postoperative changes with anterior cervical  diskectomy and instrumentation c3-c7, solid fusion c3-c5, stenosis at c7-t1 and t1-t2 without cord flattening, no fracture or cord lesion. MRA neck w/wo: no significant stenosis interna carotid arteries or vertebral arteries. MRA head, normal. TEE with normal EF, abnormal left vent diastolic dysfunction, mild MR, trace TR, left vent nml size. Carotid duplex without hemodynamically significant stenosis in the bilat common, internal and external carotid arteries. EEG abnormal, intermittent bitemporal slpwing without interictal or epileptiform abnormalities.   Review of Systems: Patient complains of symptoms per HPI as well as the following symptoms: Ringing in ears, excessive eating, daytime sleepiness, back pain, muscle cramps, itching, dizziness, headache. Pertinent negatives per HPI. All others negative.  Social History   Social History  . Marital status: Married    Spouse name: Harvie HeckRandy   . Number of children: 1  . Years of education: College   Occupational History  . Retired    Social History Main Topics  . Smoking status: Never Smoker  . Smokeless tobacco: Never Used  . Alcohol use No  . Drug use: No  . Sexual activity: Not on file   Other Topics Concern  . Not on file   Social History Narrative   Lives at home with husband, Cleda ClarksRandy Kim.   She is retired.    Has one child   Caffeine use: 1 cup a day    Family History  Problem Relation Age of Onset  . Cancer Mother   . Heart attack Mother   . Cancer Father   . Heart attack Father   . Prostate cancer Brother   . Heart attack Maternal Grandmother   . Heart attack Maternal Grandfather     Past Medical History:  Diagnosis Date  . Anxiety   . High cholesterol     Past Surgical History:  Procedure Laterality Date  . BACK SURGERY  07-06-10   "Drill out" surgery on lower back  . Herniated disc  01-2000  . Neck fusion  04-03-10   4 leve     Current Outpatient Prescriptions  Medication Sig Dispense Refill  .  amitriptyline (ELAVIL) 25 MG tablet Take 25 mg by mouth as needed.    . Calcium-Magnesium-Vitamin D (CALCIUM 1200+D3 PO) Take by mouth.    . citalopram (CELEXA) 20 MG tablet Take 10 mg by mouth 2 (two) times daily.     . cyclobenzaprine (FLEXERIL) 10 MG tablet Take 10 mg by mouth daily.    Marland Kitchen. esomeprazole (NEXIUM) 20 MG capsule Take 20 mg by mouth daily at 12 noon.    . fluvastatin XL (LESCOL XL) 80 MG 24 hr tablet Take 80 mg by mouth daily.    . Lansoprazole (PREVACID PO) Take 1 tablet by mouth daily.    Marland Kitchen. levETIRAcetam (KEPPRA) 500 MG tablet TAKE 1 TABLET TWICE DAILY 180 tablet 3  . meloxicam (MOBIC) 15 MG tablet Take 15 mg by mouth daily.    . metoprolol succinate (TOPROL-XL) 25 MG 24 hr tablet Take 12.5 mg by mouth daily.    Marland Kitchen. triamcinolone cream (KENALOG) 0.1 %  No current facility-administered medications for this visit.     Allergies as of 08/10/2016 - Review Complete 02/10/2016  Allergen Reaction Noted  . Codeine Anaphylaxis 10/08/2014  . Phenobarbital  10/08/2014  . Ultram [tramadol]  10/08/2014    Vitals: BP 121/68 (BP Location: Right Arm, Patient Position: Sitting, Cuff Size: Normal)   Pulse 80   Ht 5\' 4"  (1.626 m)   Wt 163 lb 9.6 oz (74.2 kg)   SpO2 98%   BMI 28.08 kg/m  Last Weight:  Wt Readings from Last 1 Encounters:  08/10/16 163 lb 9.6 oz (74.2 kg)   Last Height:   Ht Readings from Last 1 Encounters:  08/10/16 5\' 4"  (1.626 m)       MS:   Alert and oriented to name, date, location, situation. MoCA 30/30  Cranial Nerves:  The pupils are equal, round, and reactive to light. The fundi are normal and spontaneous venous pulsations are present. Visual fields are full to finger confrontation. Extraocular movements are intact. Trigeminal sensation is intact and the muscles of mastication are normal. The face is symmetric. The palate elevates in the midline. Hearing grossly intact to voice. Voice is normal. Shoulder shrug is normal. The tongue has normal  motion without fasciculations.   Motor: 5/5  Assessment/Plan: 70 year old female with loss of consciousness, right-sided weakness, migraines, panic attacks, anxiety, fatigue. 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 No electrographic seizures were recorded, however.) Personally reviewed EEG and agree with findings. Will continue AED.   Seizure: Continue Keppra 500mg  bid. Advised no driving until 6 months - 1 year seizure free based on locality laws, do not bathe or swim alone or do anything that could cause harm should you have a seizure. Follow up in 3 months.   Headache - better after being on CPAP for severe OSA. Continue cpap. Keppa helps. She has occ stress headaches, can treat with OTC meds and stress management.  Continue ASA 81 and statin for stroke prevention. Follow closely with pcp for management of vascular risk factors. Had extensive cardiology wkup with Dr. Graciela HusbandsKlein  Panic attacks: Diazepam 2mg  as needed  Memory loss: MoCA 30/30, no evidence for memory impairment.  Discussed: To prevent or relieve headaches, try the following: Cool Compress. Lie down and place a cool compress on your head.  Avoid headache triggers. If certain foods or odors seem to have triggered your migraines in the past, avoid them. A headache diary might help you identify triggers.  Include physical activity in your daily routine. Try a daily walk or other moderate aerobic exercise.  Manage stress. Find healthy ways to cope with the stressors, such as delegating tasks on your to-do list.  Practice relaxation techniques. Try deep breathing, yoga, massage and  visualization.  Eat regularly. Eating regularly scheduled meals and maintaining a healthy diet might help prevent headaches. Also, drink plenty of fluids.  Follow a regular sleep schedule. Sleep deprivation might contribute to headaches Consider biofeedback. With this mind-body technique, you learn to control certain bodily functions - such as muscle tension, heart rate and blood pressure - to prevent headaches or reduce headache pain.    Proceed to emergency room if you experience new or worsening symptoms or  symptoms do not resolve, if you have new neurologic symptoms or if headache is severe, or for any concerning symptom.    CC:Lindsey Lopez   Naomie Dean, MD  [facility] [address] [place], [place] [address]  Phone [phone] Fax [phone]  A total of 30 minutes was spent face-to-face with this patient. Over half this time was spent on counseling patient on the seizure, tension headache diagnosis and different diagnostic and therapeutic options available.

## 2016-08-10 NOTE — Patient Instructions (Signed)
Remember to drink plenty of fluid, eat healthy meals and do not skip any meals. Try to eat protein with a every meal and eat a healthy snack such as fruit or nuts in between meals. Try to keep a regular sleep-wake schedule and try to exercise daily, particularly in the form of walking, 20-30 minutes a day, if you can.   As far as your medications are concerned, I would like to suggest: Continue current medications  I would like to see you back as needed, sooner if we need to. Please call us with any interim questions, concerns, problems, updates or refill requests.   Our phone number is [phone]. We also have an after hours call service for urgent matters and there is a physician on-call for urgent questions. For any emergencies you know to call 911 or go to the nearest emergency room   

## 2016-09-16 DIAGNOSIS — E663 Overweight: Secondary | ICD-10-CM | POA: Diagnosis not present

## 2016-09-16 DIAGNOSIS — E785 Hyperlipidemia, unspecified: Secondary | ICD-10-CM | POA: Diagnosis not present

## 2016-09-16 DIAGNOSIS — Z6827 Body mass index (BMI) 27.0-27.9, adult: Secondary | ICD-10-CM | POA: Diagnosis not present

## 2016-09-16 DIAGNOSIS — K219 Gastro-esophageal reflux disease without esophagitis: Secondary | ICD-10-CM | POA: Diagnosis not present

## 2016-09-16 DIAGNOSIS — D509 Iron deficiency anemia, unspecified: Secondary | ICD-10-CM | POA: Diagnosis not present

## 2016-09-16 DIAGNOSIS — I10 Essential (primary) hypertension: Secondary | ICD-10-CM | POA: Diagnosis not present

## 2016-11-29 ENCOUNTER — Telehealth: Payer: Self-pay | Admitting: Neurology

## 2016-11-29 NOTE — Telephone Encounter (Signed)
Lala with [facility] is calling in reference to PA for cyclobenzaprine (FLEXERIL) 10 MG tablet that patient has requested.  Suszanne ConnersLala has one question she would like to know before she can make her decision on the PA.  Please call

## 2016-11-29 NOTE — Telephone Encounter (Signed)
Returned call to [employer]. Pt's ID # [id number]. Pt requested PA on cyclobenazprine. Pharmacy cannot fill as it is a high risk med for pt over the age of 70. Med not previously prescribed by [facility].

## 2017-01-21 DIAGNOSIS — E785 Hyperlipidemia, unspecified: Secondary | ICD-10-CM | POA: Diagnosis not present

## 2017-01-21 DIAGNOSIS — K219 Gastro-esophageal reflux disease without esophagitis: Secondary | ICD-10-CM | POA: Diagnosis not present

## 2017-01-21 DIAGNOSIS — Z6827 Body mass index (BMI) 27.0-27.9, adult: Secondary | ICD-10-CM | POA: Diagnosis not present

## 2017-01-21 DIAGNOSIS — M859 Disorder of bone density and structure, unspecified: Secondary | ICD-10-CM | POA: Diagnosis not present

## 2017-01-21 DIAGNOSIS — M130 Polyarthritis, unspecified: Secondary | ICD-10-CM | POA: Diagnosis not present

## 2017-01-21 DIAGNOSIS — I10 Essential (primary) hypertension: Secondary | ICD-10-CM | POA: Diagnosis not present

## 2017-01-21 DIAGNOSIS — B028 Zoster with other complications: Secondary | ICD-10-CM | POA: Diagnosis not present

## 2017-01-21 DIAGNOSIS — R69 Illness, unspecified: Secondary | ICD-10-CM | POA: Diagnosis not present

## 2017-01-21 DIAGNOSIS — G4700 Insomnia, unspecified: Secondary | ICD-10-CM | POA: Diagnosis not present

## 2017-01-21 DIAGNOSIS — Z791 Long term (current) use of non-steroidal anti-inflammatories (NSAID): Secondary | ICD-10-CM | POA: Diagnosis not present

## 2017-01-21 DIAGNOSIS — Z0000 Encounter for general adult medical examination without abnormal findings: Secondary | ICD-10-CM | POA: Diagnosis not present

## 2017-01-21 DIAGNOSIS — M47816 Spondylosis without myelopathy or radiculopathy, lumbar region: Secondary | ICD-10-CM | POA: Diagnosis not present

## 2017-01-21 DIAGNOSIS — Z79899 Other long term (current) drug therapy: Secondary | ICD-10-CM | POA: Diagnosis not present

## 2017-01-21 DIAGNOSIS — G40909 Epilepsy, unspecified, not intractable, without status epilepticus: Secondary | ICD-10-CM | POA: Diagnosis not present

## 2017-01-21 DIAGNOSIS — Z8249 Family history of ischemic heart disease and other diseases of the circulatory system: Secondary | ICD-10-CM | POA: Diagnosis not present

## 2017-01-21 DIAGNOSIS — Z7982 Long term (current) use of aspirin: Secondary | ICD-10-CM | POA: Diagnosis not present

## 2017-01-21 DIAGNOSIS — R209 Unspecified disturbances of skin sensation: Secondary | ICD-10-CM | POA: Diagnosis not present

## 2017-02-09 ENCOUNTER — Ambulatory Visit: Payer: Self-pay | Admitting: Neurology

## 2017-02-10 ENCOUNTER — Encounter: Payer: Self-pay | Admitting: Neurology

## 2017-02-28 DIAGNOSIS — Z6828 Body mass index (BMI) 28.0-28.9, adult: Secondary | ICD-10-CM | POA: Diagnosis not present

## 2017-02-28 DIAGNOSIS — R8299 Other abnormal findings in urine: Secondary | ICD-10-CM | POA: Diagnosis not present

## 2017-03-17 DIAGNOSIS — Z9181 History of falling: Secondary | ICD-10-CM | POA: Diagnosis not present

## 2017-03-17 DIAGNOSIS — E663 Overweight: Secondary | ICD-10-CM | POA: Diagnosis not present

## 2017-03-17 DIAGNOSIS — E785 Hyperlipidemia, unspecified: Secondary | ICD-10-CM | POA: Diagnosis not present

## 2017-03-17 DIAGNOSIS — H608X3 Other otitis externa, bilateral: Secondary | ICD-10-CM | POA: Diagnosis not present

## 2017-03-17 DIAGNOSIS — Z1389 Encounter for screening for other disorder: Secondary | ICD-10-CM | POA: Diagnosis not present

## 2017-03-17 DIAGNOSIS — Z6827 Body mass index (BMI) 27.0-27.9, adult: Secondary | ICD-10-CM | POA: Diagnosis not present

## 2017-03-17 DIAGNOSIS — D509 Iron deficiency anemia, unspecified: Secondary | ICD-10-CM | POA: Diagnosis not present

## 2017-03-17 DIAGNOSIS — I10 Essential (primary) hypertension: Secondary | ICD-10-CM | POA: Diagnosis not present

## 2017-04-04 ENCOUNTER — Encounter: Payer: Self-pay | Admitting: Neurology

## 2017-04-04 ENCOUNTER — Ambulatory Visit (INDEPENDENT_AMBULATORY_CARE_PROVIDER_SITE_OTHER): Payer: Medicare HMO | Admitting: Neurology

## 2017-04-04 VITALS — BP 115/72 | HR 77 | Ht 64.0 in | Wt 168.0 lb

## 2017-04-04 DIAGNOSIS — R51 Headache: Secondary | ICD-10-CM | POA: Diagnosis not present

## 2017-04-04 DIAGNOSIS — G4730 Sleep apnea, unspecified: Secondary | ICD-10-CM | POA: Diagnosis not present

## 2017-04-04 DIAGNOSIS — R519 Headache, unspecified: Secondary | ICD-10-CM

## 2017-04-04 NOTE — Progress Notes (Signed)
SLEEP MEDICINE CLINIC   Provider:  Melvyn Novas, M D  Referring Provider: [name], NP Primary Care Physician:  [name], NP  Chief Complaint  Patient presents with  . Follow-up    HPI:  Lindsey Lopez is a 71 y.o. female  Is seen here as a  revisit from Dr Lucia Gaskins,   Interval history from 04/04/2017. Lindsey Lopez has had no further seizure activity or strokes since last seen and 2017. Her sleep study was from February 2016.  She continues to use CPAP very compliantly and is followed by Christoper Allegra, she has used CPAP for 30 of the last 30 days with 90% compliance 4 hours, average user time is 7 hours and 37 minutes, she is using an AutoSet between 5 and 15 cm water with 2 cm EPR, AHI is 1.2 95th percentile pressure is 10 cm water. No adjustments need to be made, as there are no major air leak, and her pressure needs are well covered within the frame. She had been non compliant last year and I am happy to see her return to using CPAP .  Her Epworth sleepiness score is endorsed at 6 and her fatigue severity at 32 points, the geriatric depression score was endorsed at 2 out of 15 points not indicative of clinical depression.   Consult note : My partner ,Dr. Naomie Dean, ordered a sleep study for this stroke and seizure patient, a meanwhile 71 year old Caucasian female with a history of stroke and seizure in January 2016.  The patient also is status post cervical disc disease neck fusion. She had presented to Dr. Lucia Gaskins and Heide Guile with sleep complaints including sleep related headaches witnessed snoring and apneas and trouble maintaining sleep at night. In daytime she felt excessively sleepy and fatigued.  The patient underwent a sleep study on 10-30-14 and was diagnosed with a very mild apnea index of 6.3/hr.  but a higher RDI or so-called respiratory disturbance index of 18.4.  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 He stopped the she felt that the onset of symptoms was clearly related to her cervical fusion surgery. The patient also had documented 57 minutes of oxygen desaturation was in the 2 hours of sleep she had frequent periodic limb movements -but she did not 'kick" herself awake. Arousals related to the periodic limb movements were not seen.  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 And also had several bathroom breaks during her sleep study which could have been related to obstructive sleep apnea as well.   The patient was placed on an AutoSet machine, between 5 and 15 cm water pressure. An EPI level of 2 cm water was used.  The patient is here today was 97% compliance 29 out of 30 days and 27 out of those days she has used her machine for over 4 hours consecutively. This is still a compliance of 90% total the average user time is 7 hours and 8 minutes at night and the 95th percentile for pressure is 10.3 cm water. The residual AHI is 1.6 and the needs to be no adjustments made. She does have some nights higher air leaks and others which may be related to the mask fit. She just a new  mask fitted- a nasal pillow for her. She is now using a dream waer mask.  She needs a reorder for this interface , small headset and interface, DREAM WEAR.    She tries to go to bed between 9 and 10 PM and usually will take about 10-15 minutes to fall asleep. She rises between 6 and 6:30 in the morning, usually spontaneous. She would have 3-4 nocturia breaks interrupting her sleep before CPAP was initiated. Now she has 0. As not woken this headaches or awoken from headaches. She had severe stabbing headaches in the middle of the night before CPAP was initiated these clusters can be provoked by CO2 retention and hypoxemia at night causing him  return high blood pressures. Epworth is now 5 and FSS 24 , she feels better, her husband gets better sleep as she stopped snoring.   Interval history ; sixth of June 2017. I had the pleasure of seeing Mrs. Lindsey Lopez here again in from now on for a yearly revisit. She has been a CPAP user but has not managed to use the machine 4 hours on average at night current user time is 3 hours and 33 minutes per night, with a 40% compliance for 12 days. The patient uses an AutoSet between 5 and 15 cm water and 2 cm EPR. Residual AHI is 1.1 she does have significant air leaks intermittently but her pressure supplied is 10.3 cm for 95th percent of the time. The machine does work for her. She endorsed the Epworth sleepiness score at 1 point only. My concern is that she needs to use the machine longer per day. She is using a below the nose interface, needs a small headset. She had some trouble finding the right size and that may have contributed to nights where she didn't feel comfortable using it over up with choking for air. She orders her supply from the Internet, [employer].    The patient has seen Heide GuileLynn Lam, FNP. [place], [facility] Loss of consciousness , Dr Lucia GaskinsAhern 's visit : Interval Update 02/06/2015:   She is doing well. Tolerating Keppra well. No further episodes of seizure-like activity. On the  And feels better. Headaches are better. She is having vertigo issues, spinning sensation on head turning. Dix hallpike is negative however sounds like BPPV. Has decreased hearing as well so will refer for ENT consult.   HPI: Lindsey Lopez is a 71 y.o. female here as a referral from NP Heide GuileLynn Lam for loss of consciousness.  [place] [employer] [place] She was standing in line for a show and she felt like she was falling backwards. She was out for 10-15 minutes, totally blacked out. Husband provides most of information. Eyes closed. Everything turned black. No abnormal movements, loss of bowel or blader.  She just laid there. There was no response. She was breathing, she has no recollection of events. Gradually she began to open her eyes but couldn't respond. A nurse in the crowd helped, and the more the nurse worked with her, the more responsive she became. Right arm and right leg weren't moving as well as the left side. The right side of the face drooped. After 15 minutes she responded better, they sat her up, This is the first time, never happened before. No inciting factors. She slowly got better, was very tired afterwards, slept for 2 days. She sometimes has some word-finding difficulties otherwise back to normal. She is on aspirin now (was not before). She  is on a statin. No diabetes. There were no new medications or other inciting events. EEg slowed bitemporal slowing. No personal or FHx of seizures. Never had a stroke.  Headaches are every day. Bitemporal pressure. She is very tense. Headache wake her up in the middle of the night. When she rolls on her back she starts breathing through her mouth, significant snoring to the point she wakes herself. She is excessively tired during the day (however Epworth scale only 2). She takes naps often. She has gained 25-30 pounds recently. She has witnessed apneic events by husband. Wakes up with headaches. Daily headaches.  Reviewed notes, labs and imaging from outside physicians, which showed: MRI of the brain w/wo contrast unremarkable with some non-specific white matter periventricular increased signal, no infarct or mass or bleed, normal architecture, MRi of the cervical spine with postoperative changes with anterior cervical diskectomy and instrumentation c3-c7, solid fusion c3-c5, stenosis at c7-t1 and t1-t2 without cord flattening, no fracture or cord lesion. MRA neck w/wo: no significant stenosis interna carotid arteries or vertebral arteries. MRA head, normal. TEE with normal EF, abnormal left vent diastolic dysfunction, mild MR, trace TR, left vent nml  size. Carotid duplex without hemodynamically significant stenosis in the bilat common, internal and external carotid arteries. EEG abnormal, intermittent bitemporal slpwing without interictal or epileptiform abnormalities.   Review of Systems: None   Social History   Social History  . Marital status: Married    Spouse name: [name]   . Number of children: 1  . Years of education: College   Occupational History  . Retired    Social History Main Topics  . Smoking status: Never Smoker  . Smokeless tobacco: Never Used  . Alcohol use No  . Drug use: No  . Sexual activity: Not on file   Other Topics Concern  . Not on file   Social History Narrative   Lives at home with husband, Kittie Krizan.   She is retired.    Has one child   Caffeine use: 1 cup a day    Family History  Problem Relation Age of Onset  . Cancer Mother   . Heart attack Mother   . Cancer Father   . Heart attack Father   . Prostate cancer Brother   . Heart attack Maternal Grandmother   . Heart attack Maternal Grandfather     Past Medical History:  Diagnosis Date  . Anxiety   . High cholesterol     Past Surgical History:  Procedure Laterality Date  . BACK SURGERY  07-06-10   "Drill out" surgery on lower back  . Herniated disc  01-2000  . Neck fusion  04-03-10   4 leve     Current Outpatient Prescriptions  Medication Sig Dispense Refill  . amitriptyline (ELAVIL) 25 MG tablet Take 25 mg by mouth as needed.    Marland Kitchen aspirin EC 81 MG tablet Take 81 mg by mouth daily.    . Calcium-Magnesium-Vitamin D (CALCIUM 1200+D3 PO) Take by mouth.    . citalopram (CELEXA) 20 MG tablet Take 10 mg by mouth 2 (two) times daily.     . cyclobenzaprine (FLEXERIL) 10 MG tablet Take 10 mg by mouth daily.    Marland Kitchen esomeprazole (NEXIUM) 20 MG capsule Take 20 mg by mouth daily at 12 noon.    . fluvastatin XL (LESCOL XL) 80 MG 24 hr tablet Take 80 mg by mouth daily.    . Lansoprazole (PREVACID PO) Take 1 tablet by mouth daily.      Marland Kitchen  levETIRAcetam (KEPPRA) 500 MG tablet Take 1 tablet (500 mg total) by mouth 2 (two) times daily. 180 tablet 6  . meloxicam (MOBIC) 15 MG tablet Take 15 mg by mouth daily.    . metoprolol succinate (TOPROL-XL) 25 MG 24 hr tablet Take 12.5 mg by mouth daily.    Marland Kitchen. triamcinolone cream (KENALOG) 0.1 %      No current facility-administered medications for this visit.     Allergies as of 04/04/2017 - Review Complete 04/04/2017  Allergen Reaction Noted  . Codeine Anaphylaxis 10/08/2014  . Phenobarbital  10/08/2014  . Ultram [tramadol]  10/08/2014    Vitals: BP 115/72   Pulse 77   Ht 5\' 4"  (1.626 m)   Wt 168 lb (76.2 kg)   BMI 28.84 kg/m  Last Weight:  Wt Readings from Last 1 Encounters:  04/04/17 168 lb (76.2 kg)       Last Height:   Ht Readings from Last 1 Encounters:  04/04/17 5\' 4"  (1.626 m)    Physical exam:  General: The patient is awake, alert and appears not in acute distress. The patient is well groomed. Head: Normocephalic, atraumatic. Neck is supple. Mallampati 3   neck circumference: 154 . Nasal airflow unrestricted , TMJ is not evident . Retrognathia is not seen.  Cardiovascular:  Regular rate and rhythm, without  murmurs or carotid bruit, and without distended neck veins. Respiratory: Lungs are clear to auscultation. Trunk: BMI is elevated and patient  has normal posture.  Neurologic exam : The patient is awake and alert, oriented to place and time.  Memory subjective described as intact. There is a normal attention span & concentration ability. Speech is fluent without dysarthria, dysphonia or aphasia. Mood and affect are appropriate. Cranial nerves:Pupils are equal and briskly reactive to light.  Visual fields by finger perimetry are intact. Hearing to finger rub intact.  Facial sensation intact -Facial motor strength is symmetric and tongue and uvula move midline.  Assessment:  After physical and neurologic examination, review of laboratory studies, imaging,  neurophysiology testing and pre-existing records, assessment is   1) OSA and UARS,  following seizure  and stroke in January 2016 , treated with TPA. CPAP user, now  fully compliant.  ( patient begun snoring and having apnea following cervical fusion).  The patient has done extremely well with a dream wear mask, manufactured by [employer]. She can work with that interface and has become compliant, and after discussion with Dr. Lucia GaskinsAhern it was felt that her noncompliance previously had contributed to return of headaches. Her headaches are now well controlled - she is compliant.     Porfirio Mylararmen Malicia Blasdel MD  04/04/2017  Nurse practitioner Heide GuileLynn Lam, Medical Dr. Naomie DeanAntonia Ahern,

## 2017-04-19 ENCOUNTER — Emergency Department: Payer: Medicare HMO

## 2017-04-19 ENCOUNTER — Emergency Department
Admission: EM | Admit: 2017-04-19 | Discharge: 2017-04-19 | Disposition: A | Discharge status: Home / Self Care | Payer: Medicare HMO | Attending: Emergency Medicine | Admitting: Emergency Medicine

## 2017-04-19 ENCOUNTER — Encounter: Payer: Self-pay | Admitting: Emergency Medicine

## 2017-04-19 DIAGNOSIS — R079 Chest pain, unspecified: Secondary | ICD-10-CM

## 2017-04-19 DIAGNOSIS — Z7982 Long term (current) use of aspirin: Secondary | ICD-10-CM | POA: Diagnosis not present

## 2017-04-19 DIAGNOSIS — Z79899 Other long term (current) drug therapy: Secondary | ICD-10-CM | POA: Diagnosis not present

## 2017-04-19 DIAGNOSIS — R0989 Other specified symptoms and signs involving the circulatory and respiratory systems: Secondary | ICD-10-CM | POA: Insufficient documentation

## 2017-04-19 DIAGNOSIS — R0789 Other chest pain: Secondary | ICD-10-CM | POA: Diagnosis not present

## 2017-04-19 HISTORY — DX: Unspecified convulsions: R56.9

## 2017-04-19 HISTORY — DX: Cerebral infarction, unspecified: I63.9

## 2017-04-19 LAB — BASIC METABOLIC PANEL
Anion gap: 7 (ref 5–15)
BUN: 12 mg/dL (ref 6–20)
CHLORIDE: 106 mmol/L (ref 101–111)
CO2: 28 mmol/L (ref 22–32)
CREATININE: 0.95 mg/dL (ref 0.44–1.00)
Calcium: 9.4 mg/dL (ref 8.9–10.3)
GFR calc Af Amer: >60 mL/min (ref >60)
GFR calc non Af Amer: 60 mL/min — ABNORMAL LOW (ref >60)
Glucose, Bld: 134 mg/dL — ABNORMAL HIGH (ref 65–99)
POTASSIUM: 3.8 mmol/L (ref 3.5–5.1)
Sodium: 141 mmol/L (ref 135–145)

## 2017-04-19 LAB — TROPONIN I: Troponin I: <0.03 ng/mL (ref <0.03)

## 2017-04-19 LAB — CBC
HCT: 37.7 % (ref 35.0–47.0)
Hemoglobin: 12.7 g/dL (ref 12.0–16.0)
MCH: 26.7 pg (ref 26.0–34.0)
MCHC: 33.7 g/dL (ref 32.0–36.0)
MCV: 79.2 fL — AB (ref 80.0–100.0)
Platelets: 224 10*3/uL (ref 150–440)
RBC: 4.76 MIL/uL (ref 3.80–5.20)
RDW: 16.2 % — ABNORMAL HIGH (ref 11.5–14.5)
WBC: 7.9 10*3/uL (ref 3.6–11.0)

## 2017-04-19 NOTE — ED Provider Notes (Signed)
[facility] Emergency Department Provider Note  ____________________________________________   First MD Initiated Contact with Patient 04/19/17 2206     (approximate)  I have reviewed the triage vital signs and the nursing notes.   HISTORY  Chief Complaint Chest Pain   HPI Lindsey Lopez is a 71 y.o. female with a history of CVA who is presenting to the emergency department today with a bandlike chest tightness that has been ongoing since this past Friday as well as cough. She says that she choked on a cupcake this past Friday needed the Heimlich maneuver done to her. She says that she was unconscious for about 1 minute. Since then, she has been feeling irritation to her throat as well as a bandlike sensation around her chest. She has had a cough that she feels is secondary to the irritation in her throat. Denies any fever. Denies any sputum production.   Past Medical History:  Diagnosis Date  . Anxiety   . CVA (cerebral vascular accident) (HCC) 09/24/2014  . High cholesterol   . Seizure [facility])     Patient Active Problem List   Diagnosis Date Noted  . OSA on CPAP 02/10/2015  . UARS (upper airway resistance syndrome) 02/10/2015  . Sleep related headaches 02/10/2015  . Seizures (HCC) 02/07/2015  . Benign paroxysmal positional vertigo 02/07/2015  . Altered mental status 10/08/2014  . Headache 10/08/2014    Past Surgical History:  Procedure Laterality Date  . BACK SURGERY  07-06-10   "Drill out" surgery on lower back  . Herniated disc  01-2000  . Neck fusion  04-03-10   4 leve     Prior to Admission medications   Medication Sig Start Date End Date Taking? Authorizing Provider  amitriptyline (ELAVIL) 25 MG tablet Take 25 mg by mouth as needed. 07/06/15   [provider]  aspirin EC 81 MG tablet Take 81 mg by mouth daily.    [provider]  Calcium-Magnesium-Vitamin D (CALCIUM 1200+D3 PO) Take by mouth.    [provider]  citalopram (CELEXA) 20 MG tablet Take 10 mg by mouth 2 (two) times daily.     [provider]  cyclobenzaprine (FLEXERIL) 10 MG tablet Take 10 mg by mouth daily.    [provider]  esomeprazole (NEXIUM) 20 MG capsule Take 20 mg by mouth daily at 12 noon.    [provider]  fluvastatin XL (LESCOL XL) 80 MG 24 hr tablet Take 80 mg by mouth daily.    [provider]  Lansoprazole (PREVACID PO) Take 1 tablet by mouth daily.    [provider]  levETIRAcetam (KEPPRA) 500 MG tablet Take 1 tablet (500 mg total) by mouth 2 (two) times daily. 08/10/16   [name], MD  meloxicam (MOBIC) 15 MG tablet Take 15 mg by mouth daily.    [provider]  metoprolol succinate (TOPROL-XL) 25 MG 24 hr tablet Take 12.5 mg by mouth daily. 09/11/14   [provider]  triamcinolone cream (KENALOG) 0.1 %  06/17/15   [provider]    Allergies Codeine; Phenobarbital; and Ultram [tramadol]  Family History  Problem Relation Age of Onset  . Cancer Mother   . Heart attack Mother   . Cancer Father   . Heart attack Father   . Prostate cancer Brother   . Heart attack Maternal Grandmother   . Heart attack Maternal Grandfather     Social History Social History  Substance Use Topics  .  Smoking status: Never Smoker  . Smokeless tobacco: Never Used  . Alcohol use No    Review of Systems  Constitutional: No fever/chills Eyes: No visual changes. ENT: No sore throat. Cardiovascular:  As above Respiratory: as above Gastrointestinal: No abdominal pain.  No nausea, no vomiting.  No diarrhea.  No constipation. Genitourinary: Negative for dysuria. Musculoskeletal: Negative for back pain. Skin: Negative for rash. Neurological: Negative for headaches, focal weakness or numbness.   ____________________________________________   PHYSICAL EXAM:  VITAL SIGNS: ED Triage Vitals  Enc Vitals Group     BP 04/19/17 1933 (!) 141/84      Pulse Rate 04/19/17 1933 83     Resp 04/19/17 1933 18     Temp 04/19/17 1933 98.1 F (36.7 C)     Temp Source 04/19/17 1933 Oral     SpO2 04/19/17 1933 99 %     Weight 04/19/17 1931 167 lb (75.8 kg)     Height 04/19/17 1931 5' 4.5" (1.638 m)     Head Circumference --      Peak Flow --      Pain Score 04/19/17 1931 7     Pain Loc --      Pain Edu? --      Excl. in GC? --     Constitutional: Alert and oriented. Well appearing and in no acute distress. Eyes: Conjunctivae are normal.  Head: Atraumatic. Nose: No congestion/rhinnorhea. Mouth/Throat: Mucous membranes are moist. No swollen tongue, uvula or tonsils. Neck: No stridor.   Cardiovascular: Normal rate, regular rhythm. Grossly normal heart sounds.  No tenderness to palpation of the chest wall. No bruising. Respiratory: Normal respiratory effort.  No retractions. Lungs CTAB. Gastrointestinal: Soft and nontender. No distention.  Musculoskeletal: No lower extremity tenderness nor edema.  No joint effusions. Neurologic:  Normal speech and language. No gross focal neurologic deficits are appreciated. Skin:  Skin is warm, dry and intact. No rash noted. Psychiatric: Mood and affect are normal. Speech and behavior are normal.  ____________________________________________   LABS (all labs ordered are listed, but only abnormal results are displayed)  Labs Reviewed  BASIC METABOLIC PANEL - Abnormal; Notable for the following:       Result Value   Glucose, Bld 134 (*)    GFR calc non Af Amer 60 (*)    All other components within normal limits  CBC - Abnormal; Notable for the following:    MCV 79.2 (*)    RDW 16.2 (*)    All other components within normal limits  TROPONIN I   ____________________________________________  EKG  ED ECG REPORT I, Arelia Longest, the attending physician, personally viewed and interpreted this ECG.   Date: 04/19/2017  EKG Time: 1930  Rate: 84  Rhythm: normal sinus rhythm  Axis: Normal   Intervals:none  ST&T Change: No ST segment elevation or depression. No abnormal T-wave inversion.  ____________________________________________  RADIOLOGY  No acute disease on the chest x-ray ____________________________________________   PROCEDURES  Procedure(s) performed:   Procedures  Critical Care performed:   ____________________________________________   INITIAL IMPRESSION / ASSESSMENT AND PLAN / ED COURSE  Pertinent labs & imaging results that were available during my care of the patient were reviewed by me and considered in my medical decision making (see chart for details).  Patient lives with pain secondary to having Heimlich maneuver performed on her chest. Lightly with abrasion from the cupcake scraping the back of her throat and being lodged in her posterior pharynx. Her lungs  were clear. She denies fever. Normal white blood cell count and no acute findings on her chest x-ray. Unlikely to have aspirated any significant amount of food to cause infection. Patient says that she has had trouble swallowing of the neck surgery.  I recommended that she try a soft diet and avoid steak. She would following up with her primary care doctor as well as gastrology. She is understanding the plan and will comply.     ____________________________________________   FINAL CLINICAL IMPRESSION(S) / ED DIAGNOSES  Choking episode. Chest pain.     NEW MEDICATIONS STARTED DURING THIS VISIT:  New Prescriptions   No medications on file     Note:  This document was prepared using Dragon voice recognition software and may include unintentional dictation errors.     Myrna BlazerSchaevitz, Alyssa Mancera Matthew, MD 04/19/17 [phone]

## 2017-04-19 NOTE — ED Triage Notes (Signed)
Patient states at 1240 on Friday she was eating cupcake and swallowed a piece down and got choked. Patient states family member performed heimlich maneuver and was able to relieve problem, but doesn't remember events surrounding that episode. States since then she has had tightness/pressure across bra line to both sides and middle. Reports coughing episodes with shortness of breath as well.

## 2017-04-19 NOTE — ED Notes (Signed)
Patient transported to X-ray 

## 2017-05-02 ENCOUNTER — Ambulatory Visit (INDEPENDENT_AMBULATORY_CARE_PROVIDER_SITE_OTHER): Payer: Medicare HMO | Admitting: Gastroenterology

## 2017-05-02 ENCOUNTER — Encounter: Payer: Self-pay | Admitting: Gastroenterology

## 2017-05-02 ENCOUNTER — Other Ambulatory Visit: Payer: Self-pay

## 2017-05-02 VITALS — BP 144/80 | HR 97 | Ht 64.0 in | Wt 170.4 lb

## 2017-05-02 DIAGNOSIS — T17320A Food in larynx causing asphyxiation, initial encounter: Secondary | ICD-10-CM | POA: Diagnosis not present

## 2017-05-02 DIAGNOSIS — W44F3XA Food entering into or through a natural orifice, initial encounter: Secondary | ICD-10-CM | POA: Insufficient documentation

## 2017-05-02 DIAGNOSIS — R1310 Dysphagia, unspecified: Secondary | ICD-10-CM | POA: Diagnosis not present

## 2017-05-02 NOTE — Progress Notes (Signed)
[name], MD [address]  [address]  [place], [place] [address]  Main: [phone]  Fax: [phone]    Gastroenterology Consultation  Referring Provider:     [name], NP Primary Care Physician:  [name], NP Primary Gastroenterologist:  Dr. [name] Reason for Consultation:     Dysphagia        HPI:   Lindsey Lopez is a 71 y.o. y/o female referred by Dr. [name], NP  for consultation & management of dysphagia. She had a cervical spine fusion in 2011. Subsequently, she has been experiencing intermittent episodes of choking spells. On 8/14, she went to the ER after she had a cupcake and she choked, her husband performed Heimlich's maneuver followed by that, she passed out. For the last 3 weeks she has been having these choking spells almost on a daily basis, yesterday she was trying to eat some cake she made it home and she choked on it. She has been taking Nexium in the morning and Prilosec in the evening since her neck surgery in 2011 as she was told that she has acid reflux. She has been experiencing acid taste in her throat for a long time. She denies having food impaction, nocturnal symptoms, no known food allergies, denies epigastric pain, weight loss. She never had an EGD before. She denies smoking or alcohol. Her mom had small intestine cancer status post resection.  She had a barium esophagogram in 2011 after the neck surgery, which showed tertiary contractions in the esophagus, mild compression of the posterior wall of the esophagus secondary to bony spur  GI Procedures: Colonoscopy in 2012, reportedly normal. Denies family history of colon cancer.  Past Medical History:  Diagnosis Date  . Anxiety   . CVA (cerebral vascular accident) (HCC) 09/24/2014  . High cholesterol   . Seizure [facility])     Past Surgical History:  Procedure Laterality Date  . BACK SURGERY  07-06-10   "Drill out" surgery on lower back  . Herniated disc  01-2000  . Neck  fusion  04-03-10   4 leve     Prior to Admission medications   Medication Sig Start Date End Date Taking? Authorizing Provider  amitriptyline (ELAVIL) 25 MG tablet Take 25 mg by mouth as needed. 07/06/15   [provider]  aspirin EC 81 MG tablet Take 81 mg by mouth daily.    [provider]  Calcium-Magnesium-Vitamin D (CALCIUM 1200+D3 PO) Take by mouth.    [provider]  citalopram (CELEXA) 20 MG tablet Take 10 mg by mouth 2 (two) times daily.     [provider]  cyclobenzaprine (FLEXERIL) 10 MG tablet Take 10 mg by mouth daily.    [provider]  esomeprazole (NEXIUM) 20 MG capsule Take 20 mg by mouth daily at 12 noon.    [provider]  fluvastatin XL (LESCOL XL) 80 MG 24 hr tablet Take 80 mg by mouth daily.    [provider]  Lansoprazole (PREVACID PO) Take 1 tablet by mouth daily.    [provider]  levETIRAcetam (KEPPRA) 500 MG tablet Take 1 tablet (500 mg total) by mouth 2 (two) times daily. 08/10/16   [name], MD  meloxicam (MOBIC) 15 MG tablet Take 15 mg by mouth daily.    [provider]  metoprolol succinate (TOPROL-XL) 25 MG 24 hr tablet Take 12.5 mg by mouth daily. 09/11/14   [provider]  triamcinolone  cream (KENALOG) 0.1 %  06/17/15   [provider]  valACYclovir (VALTREX) 500 MG tablet  04/27/17   [provider]    Family History  Problem Relation Age of Onset  . Cancer Mother   . Heart attack Mother   . Cancer Father   . Heart attack Father   . Prostate cancer Brother   . Heart attack Maternal Grandmother   . Heart attack Maternal Grandfather      Social History  Substance Use Topics  . Smoking status: Never Smoker  . Smokeless tobacco: Never Used  . Alcohol use No    Allergies as of 05/02/2017 - Review Complete 04/19/2017  Allergen Reaction Noted  . Codeine Anaphylaxis 10/08/2014  . Phenobarbital  10/08/2014  . Ultram [tramadol]   10/08/2014    Review of Systems:    All systems reviewed and negative except where noted in HPI.   Physical Exam:  There were no vitals taken for this visit. No LMP recorded. Patient is postmenopausal.  General:   Alert,  Well-developed, well-nourished, pleasant and cooperative in NAD Head:  Normocephalic and atraumatic. Eyes:  Sclera clear, no icterus.   Conjunctiva pink. Ears:  Normal auditory acuity. Nose:  No deformity, discharge, or lesions. Mouth:  No deformity or lesions,oropharynx pink & moist. Neck:  Supple; no masses or thyromegaly. Lungs:  Respirations even and unlabored.  Clear throughout to auscultation.   No wheezes, crackles, or rhonchi. No acute distress. Heart:  Regular rate and rhythm; no murmurs, clicks, rubs, or gallops. Abdomen:  Normal bowel sounds.  No bruits.  Soft, non-tender and non-distended without masses, hepatosplenomegaly or hernias noted.  No guarding or rebound tenderness.   Rectal: Nor performed Msk:  Symmetrical without gross deformities. Good, equal movement & strength bilaterally. Pulses:  Normal pulses noted. Extremities:  No clubbing or edema.  No cyanosis. Neurologic:  Alert and oriented x3;  grossly normal neurologically. Skin:  Intact without significant lesions or rashes. No jaundice. Lymph Nodes:  No significant cervical adenopathy. Psych:  Alert and cooperative. Normal mood and affect.  Imaging Studies: ESOPHOGRAM/BARIUM SWALLOW 03/11/2010  There is mild dysmotility with intermittent tertiary contractions noted.Mild extrinsic compression upon the posterior cervical esophagus due to prominent bone spurs noted, but felt unlikely to be clinically significant.  Assessment and Plan:   Lindsey Lopez is a 71 y.o. y/o female with Chronic heartburn, intermittent episodes of Choking spells, progressively worse in last 3 weeks. It could very well be secondary to extrinsic compression from cervical bone spur. Given that she has these symptoms  chronically, I will perform EGD to rule out stricture/ring/erosive esophagitis. Based on the EGD results, I will proceed with esophageal manometry. I also suggested her to stay on either Nexium twice a day or Prilosec twice a day, 30 minutes before meals.  Follow up in 3 months  [name], MD

## 2017-05-02 NOTE — Patient Instructions (Signed)
1. Switch to either nexium or prilosec two times daily before meals 2. Schedule EGD with biopsies  Please call our office to speak with my nurse Iva Lento at [phone] during business hours from 8am to 4pm if you have any questions/concerns. During after hours, you will be redirected to on call GI physician. For any emergency please call 911 or go the nearest emergency room.    [name], MD [address]  [address]  [place], [place] [address]  Main: [phone]  Fax: [phone]

## 2017-05-02 NOTE — Addendum Note (Signed)
Addended by: Avie Arenas on: 05/02/2017 03:27 PM   Modules accepted: Orders, SmartSet

## 2017-05-03 ENCOUNTER — Telehealth: Payer: Self-pay | Admitting: Gastroenterology

## 2017-05-03 NOTE — Telephone Encounter (Signed)
Patient called and her insurance doesn't cover [facility] so she needs to r/s to  [facility], please call patient.

## 2017-05-04 ENCOUNTER — Other Ambulatory Visit: Payer: Self-pay

## 2017-05-04 ENCOUNTER — Telehealth: Payer: Self-pay

## 2017-05-04 DIAGNOSIS — R1310 Dysphagia, unspecified: Secondary | ICD-10-CM

## 2017-05-04 NOTE — Telephone Encounter (Signed)
[facility]

## 2017-05-04 NOTE — Telephone Encounter (Signed)
Patient is returning your call to change appt time. Please call her at home

## 2017-05-04 NOTE — Telephone Encounter (Signed)
EGD has been moved to [facility] not covered by insurance at [facility]. Date changed to 06/09/17.

## 2017-05-17 ENCOUNTER — Ambulatory Visit: Admit: 2017-05-17 | Payer: Medicare HMO

## 2017-05-17 SURGERY — ESOPHAGOGASTRODUODENOSCOPY (EGD) WITH PROPOFOL
Anesthesia: Choice

## 2017-06-09 ENCOUNTER — Ambulatory Visit: Payer: Medicare HMO | Admitting: Anesthesiology

## 2017-06-09 ENCOUNTER — Encounter: Payer: Self-pay | Admitting: *Deleted

## 2017-06-09 ENCOUNTER — Ambulatory Visit
Admission: RE | Admit: 2017-06-09 | Discharge: 2017-06-09 | Disposition: A | Discharge status: Home / Self Care | Payer: Medicare HMO | Source: Ambulatory Visit | Attending: Gastroenterology | Admitting: Gastroenterology

## 2017-06-09 ENCOUNTER — Encounter
Admission: RE | Disposition: A | Discharge status: Home / Self Care | Payer: Self-pay | Source: Ambulatory Visit | Attending: Gastroenterology

## 2017-06-09 DIAGNOSIS — Z7982 Long term (current) use of aspirin: Secondary | ICD-10-CM | POA: Insufficient documentation

## 2017-06-09 DIAGNOSIS — Z888 Allergy status to other drugs, medicaments and biological substances status: Secondary | ICD-10-CM | POA: Insufficient documentation

## 2017-06-09 DIAGNOSIS — Z79899 Other long term (current) drug therapy: Secondary | ICD-10-CM | POA: Insufficient documentation

## 2017-06-09 DIAGNOSIS — K2950 Unspecified chronic gastritis without bleeding: Secondary | ICD-10-CM | POA: Diagnosis not present

## 2017-06-09 DIAGNOSIS — Z9989 Dependence on other enabling machines and devices: Secondary | ICD-10-CM | POA: Diagnosis not present

## 2017-06-09 DIAGNOSIS — E7800 Pure hypercholesterolemia, unspecified: Secondary | ICD-10-CM | POA: Insufficient documentation

## 2017-06-09 DIAGNOSIS — R69 Illness, unspecified: Secondary | ICD-10-CM | POA: Diagnosis not present

## 2017-06-09 DIAGNOSIS — R1310 Dysphagia, unspecified: Secondary | ICD-10-CM | POA: Diagnosis not present

## 2017-06-09 DIAGNOSIS — L83 Acanthosis nigricans: Secondary | ICD-10-CM | POA: Insufficient documentation

## 2017-06-09 DIAGNOSIS — Z981 Arthrodesis status: Secondary | ICD-10-CM | POA: Insufficient documentation

## 2017-06-09 DIAGNOSIS — Z8249 Family history of ischemic heart disease and other diseases of the circulatory system: Secondary | ICD-10-CM | POA: Diagnosis not present

## 2017-06-09 DIAGNOSIS — K209 Esophagitis, unspecified: Secondary | ICD-10-CM | POA: Diagnosis not present

## 2017-06-09 DIAGNOSIS — K3189 Other diseases of stomach and duodenum: Secondary | ICD-10-CM | POA: Diagnosis not present

## 2017-06-09 DIAGNOSIS — Z885 Allergy status to narcotic agent status: Secondary | ICD-10-CM | POA: Insufficient documentation

## 2017-06-09 DIAGNOSIS — G4730 Sleep apnea, unspecified: Secondary | ICD-10-CM | POA: Insufficient documentation

## 2017-06-09 DIAGNOSIS — I69311 Memory deficit following cerebral infarction: Secondary | ICD-10-CM | POA: Insufficient documentation

## 2017-06-09 DIAGNOSIS — F419 Anxiety disorder, unspecified: Secondary | ICD-10-CM | POA: Insufficient documentation

## 2017-06-09 HISTORY — PX: ESOPHAGOGASTRODUODENOSCOPY (EGD) WITH PROPOFOL: SHX5813

## 2017-06-09 SURGERY — ESOPHAGOGASTRODUODENOSCOPY (EGD) WITH PROPOFOL
Anesthesia: General

## 2017-06-09 MED ORDER — GLYCOPYRROLATE 0.2 MG/ML IJ SOLN
INTRAMUSCULAR | Status: AC
  Filled 2017-06-09: qty 1

## 2017-06-09 MED ORDER — PROPOFOL 500 MG/50ML IV EMUL
INTRAVENOUS | Status: AC
  Filled 2017-06-09: qty 50

## 2017-06-09 MED ORDER — MIDAZOLAM HCL 2 MG/2ML IJ SOLN
INTRAMUSCULAR | Status: AC
  Filled 2017-06-09: qty 2

## 2017-06-09 MED ORDER — SODIUM CHLORIDE 0.9 % IV SOLN
INTRAVENOUS | Status: DC
  Administered 2017-06-09: 07:00:00 via INTRAVENOUS

## 2017-06-09 MED ORDER — GLYCOPYRROLATE 0.2 MG/ML IJ SOLN
INTRAMUSCULAR | Status: DC | PRN
  Administered 2017-06-09: 0.2 mg via INTRAVENOUS

## 2017-06-09 MED ORDER — PROPOFOL 500 MG/50ML IV EMUL
INTRAVENOUS | Status: DC | PRN
  Administered 2017-06-09: 150 ug/kg/min via INTRAVENOUS

## 2017-06-09 MED ORDER — PROPOFOL 10 MG/ML IV BOLUS
INTRAVENOUS | Status: DC | PRN
  Administered 2017-06-09: 50 mg via INTRAVENOUS

## 2017-06-09 MED ORDER — LIDOCAINE 2% (20 MG/ML) 5 ML SYRINGE
INTRAMUSCULAR | Status: DC | PRN
  Administered 2017-06-09: 50 mg via INTRAVENOUS

## 2017-06-09 MED ORDER — LIDOCAINE HCL (PF) 2 % IJ SOLN
INTRAMUSCULAR | Status: AC
  Filled 2017-06-09: qty 10

## 2017-06-09 MED ORDER — MIDAZOLAM HCL 2 MG/2ML IJ SOLN
INTRAMUSCULAR | Status: DC | PRN
  Administered 2017-06-09: 2 mg via INTRAVENOUS

## 2017-06-09 NOTE — Anesthesia Post-op Follow-up Note (Signed)
Anesthesia QCDR form completed.        

## 2017-06-09 NOTE — Anesthesia Postprocedure Evaluation (Signed)
Anesthesia Post Note  Patient: Maryla Morrow  Procedure(s) Performed: ESOPHAGOGASTRODUODENOSCOPY (EGD) WITH PROPOFOL (N/A )  Patient location during evaluation: Endoscopy Anesthesia Type: General Level of consciousness: awake and alert and oriented Pain management: pain level controlled Vital Signs Assessment: post-procedure vital signs reviewed and stable Respiratory status: spontaneous breathing, nonlabored ventilation and respiratory function stable Cardiovascular status: blood pressure returned to baseline and stable Postop Assessment: no signs of nausea or vomiting Anesthetic complications: no     Last Vitals:  Vitals:   06/09/17 0850 06/09/17 0900  BP: 140/82 135/78  Pulse: 79 77  Resp: 15 16  Temp:    SpO2: 95% 98%    Last Pain:  Vitals:   06/09/17 0830  TempSrc: Tympanic                 Tammera Engert

## 2017-06-09 NOTE — Anesthesia Preprocedure Evaluation (Signed)
Anesthesia Evaluation  Patient identified by MRN, date of birth, ID band Patient awake    Reviewed: Allergy & Precautions, NPO status , Patient's Chart, lab work & pertinent test results  History of Anesthesia Complications Negative for: history of anesthetic complications  Airway Mallampati: II  TM Distance: >3 FB Neck ROM: Full    Dental  (+) Upper Dentures, Lower Dentures   Pulmonary sleep apnea and Continuous Positive Airway Pressure Ventilation , neg COPD,    breath sounds clear to auscultation- rhonchi (-) wheezing      Cardiovascular Exercise Tolerance: Good (-) hypertension(-) CAD, (-) Past MI and (-) Cardiac Stents  Rhythm:Regular Rate:Normal - Systolic murmurs and - Diastolic murmurs    Neuro/Psych  Headaches, Seizures: had seizure at the same time as her stroke, none before and none since.  Anxiety CVA (mild memory problems)    GI/Hepatic negative GI ROS, Neg liver ROS,   Endo/Other  negative endocrine ROSneg diabetes  Renal/GU negative Renal ROS     Musculoskeletal negative musculoskeletal ROS (+)   Abdominal (+) - obese,   Peds  Hematology negative hematology ROS (+)   Anesthesia Other Findings Past Medical History: No date: Anxiety 09/24/2014: CVA (cerebral vascular accident) (HCC)     Comment:  TPA reversed the symptoms No date: High cholesterol No date: Seizure (HCC)   Reproductive/Obstetrics                             Anesthesia Physical Anesthesia Plan  ASA: III  Anesthesia Plan: General   Post-op Pain Management:    Induction: Intravenous  PONV Risk Score and Plan: 2 and Propofol infusion  Airway Management Planned: Natural Airway  Additional Equipment:   Intra-op Plan:   Post-operative Plan:   Informed Consent: I have reviewed the patients History and Physical, chart, labs and discussed the procedure including the risks, benefits and alternatives for  the proposed anesthesia with the patient or authorized representative who has indicated his/her understanding and acceptance.   Dental advisory given  Plan Discussed with: CRNA and Anesthesiologist  Anesthesia Plan Comments:         Anesthesia Quick Evaluation

## 2017-06-09 NOTE — H&P (Signed)
Lindsey Repress, MD [address]  [address]  [place], [place] [address]  Main: [phone]  Fax: [phone] Pager: [phone]  Primary Care Physician:  Lindsey Fear, NP Primary Gastroenterologist:  Dr. Arlyss Lopez  Pre-Procedure History & Physical: HPI:  Lindsey Lopez is a 71 y.o. female is here for an endoscopy.   Past Medical History:  Diagnosis Date  . Anxiety   . CVA (cerebral vascular accident) (HCC) 09/24/2014   TPA reversed the symptoms  . High cholesterol   . Seizure [facility])     Past Surgical History:  Procedure Laterality Date  . BACK SURGERY  07-06-10   "Drill out" surgery on lower back  . Herniated disc  01-2000  . Neck fusion  04-03-10   4 leve     Prior to Admission medications   Medication Sig Start Date End Date Taking? Authorizing Provider  aspirin EC 81 MG tablet Take 81 mg by mouth daily.   Yes [provider]  Calcium-Magnesium-Vitamin D (CALCIUM 1200+D3 PO) Take by mouth.   Yes [provider]  citalopram (CELEXA) 20 MG tablet Take 10 mg by mouth 2 (two) times daily.    Yes [provider]  esomeprazole (NEXIUM) 20 MG capsule Take 20 mg by mouth daily at 12 noon.   Yes [provider]  levETIRAcetam (KEPPRA) 500 MG tablet Take 1 tablet (500 mg total) by mouth 2 (two) times daily. 08/10/16  Yes Lindsey Fret, MD  meloxicam (MOBIC) 15 MG tablet Take 15 mg by mouth daily.   Yes [provider]  metoprolol succinate (TOPROL-XL) 25 MG 24 hr tablet Take 12.5 mg by mouth daily. 09/11/14  Yes [provider]  valACYclovir (VALTREX) 500 MG tablet  04/27/17  Yes [provider]  amitriptyline (ELAVIL) 25 MG tablet Take 25 mg by mouth as needed. 07/06/15   [provider]  cyclobenzaprine (FLEXERIL) 10 MG tablet Take 10 mg by mouth daily.    [provider]  fluvastatin XL (LESCOL XL) 80 MG 24 hr tablet Take 80 mg by mouth daily.    [provider]  Lansoprazole  (PREVACID PO) Take 1 tablet by mouth daily.    [provider]  triamcinolone cream (KENALOG) 0.1 %  06/17/15   [provider]    Allergies as of 05/04/2017 - Review Complete 05/02/2017  Allergen Reaction Noted  . Codeine Anaphylaxis 10/08/2014  . Phenobarbital  10/08/2014  . Ultram [tramadol]  10/08/2014    Family History  Problem Relation Age of Onset  . Cancer Mother   . Heart attack Mother   . Cancer Father   . Heart attack Father   . Prostate cancer Brother   . Heart attack Maternal Grandmother   . Heart attack Maternal Grandfather     Social History   Social History  . Marital status: Married    Spouse name: Lindsey Lopez   . Number of children: 1  . Years of education: College   Occupational History  . Retired    Social History Main Topics  . Smoking status: Never Smoker  . Smokeless tobacco: Never Used  . Alcohol use No  . Drug use: No  . Sexual activity: Not on file   Other Topics Concern  . Not on file   Social History Narrative   Lives at home with husband, Lindsey Lopez.   She is retired.    Has one child   Caffeine use: 1 cup a day  Review of Systems: See HPI, otherwise negative ROS  Physical Exam: BP 126/76   Pulse 77   Temp 97.7 F (36.5 C) (Tympanic)   Resp 16   Ht  (1.626 m)   Wt 164 lb (74.4 kg)   LMP 06/12/1989 (Exact Date)   SpO2 99%   BMI 28.15 kg/m  General:   Alert,  pleasant and cooperative in NAD Head:  Normocephalic and atraumatic. Neck:  Supple; no masses or thyromegaly. Lungs:  Clear throughout to auscultation.    Heart:  Regular rate and rhythm. Abdomen:  Soft, nontender and nondistended. Normal bowel sounds, without guarding, and without rebound.   Neurologic:  Alert and  oriented x4;  grossly normal neurologically.  Impression/Plan: Lindsey Lopez is here for an endoscopy to be performed for dysphagia  Risks, benefits, limitations, and alternatives regarding  endoscopy have been reviewed with  the patient.  Questions have been answered.  All parties agreeable.   [name], MD  06/09/2017, 8:07 AM

## 2017-06-09 NOTE — Op Note (Signed)
[facility] Gastroenterology Patient Name: Lindsey Lopez Procedure Date: 06/09/2017 8:11 AM MRN: [id number] Account #: [id number] Date of Birth: 1946-06-10 Admit Type: Outpatient Age: 71 Room: [facility] ENDO ROOM 4 Gender: Female Note Status: Finalized Procedure:            Upper GI endoscopy Indications:          Dysphagia Providers:            Lin Landsman MD, MD Referring MD:         Philmore Pali (Referring MD) Medicines:            Monitored Anesthesia Care Complications:        No immediate complications. Estimated blood loss:                        Minimal. Procedure:            Pre-Anesthesia Assessment:                       - Prior to the procedure, a History and Physical was                        performed, and patient medications and allergies were                        reviewed. The patient is competent. The risks and                        benefits of the procedure and the sedation options and                        risks were discussed with the patient. All questions                        were answered and informed consent was obtained.                        Patient identification and proposed procedure were                        verified by the physician, the nurse, the                        anesthesiologist, the anesthetist and the technician in                        the pre-procedure area in the procedure room. Mental                        Status Examination: alert and oriented. Airway                        Examination: normal oropharyngeal airway and neck                        mobility. Respiratory Examination: clear to                        auscultation. CV Examination: normal. Prophylactic  Antibiotics: The patient does not require prophylactic                        antibiotics. Prior Anticoagulants: The patient has                        taken aspirin, last dose was 1 day prior to procedure.          ASA Grade Assessment: II - A patient with mild systemic                        disease. After reviewing the risks and benefits, the                        patient was deemed in satisfactory condition to undergo                        the procedure. The anesthesia plan was to use monitored                        anesthesia care (MAC). Immediately prior to                        administration of medications, the patient was                        re-assessed for adequacy to receive sedatives. The                        heart rate, respiratory rate, oxygen saturations, blood                        pressure, adequacy of pulmonary ventilation, and                        response to care were monitored throughout the                        procedure. The physical status of the patient was                        re-assessed after the procedure.                       After obtaining informed consent, the endoscope was                        passed under direct vision. Throughout the procedure,                        the patient's blood pressure, pulse, and oxygen                        saturations were monitored continuously. The Endoscope                        was introduced through the mouth, and advanced to the                        second part of duodenum. The upper GI endoscopy was  accomplished without difficulty. The patient tolerated                        the procedure well. Findings:      The duodenal bulb and second portion of the duodenum were normal.      Diffuse mildly erythematous mucosa without bleeding was found in the       gastric antrum.      The cardia, gastric fundus and gastric body were normal. Biopsies were       taken with a cold forceps for Helicobacter pylori testing.      Esophagogastric landmarks were identified: the gastroesophageal junction       was found at 38 cm from the incisors.      The entire esophagus was normal. Biopsies  were obtained from the       proximal and distal esophagus with cold forceps for histology of       suspected eosinophilic esophagitis. Impression:           - Normal duodenal bulb and second portion of the                        duodenum.                       - Erythematous mucosa in the antrum.                       - Normal cardia, gastric fundus and gastric body.                        Biopsied.                       - Esophagogastric landmarks identified.                       - Normal esophagus. Biopsied. Recommendation:       - Discharge patient to home.                       - Resume previous diet today.                       - Continue present medications.                       - Await pathology results, if normal, refer to                        esophageal manometry. Procedure Code(s):    --- Professional ---                       [phone], Esophagogastroduodenoscopy, flexible, transoral;                        with biopsy, single or multiple Diagnosis Code(s):    --- Professional ---                       K31.89, Other diseases of stomach and duodenum                       R13.10, Dysphagia, unspecified CPT copyright 2016 American Medical Association. All rights  reserved. The codes documented in this report are preliminary and upon coder review may  be revised to meet current compliance requirements. Dr. Ulyess Mort Lin Landsman MD, MD 06/09/2017 8:31:39 AM This report has been signed electronically. Number of Addenda: 0 Note Initiated On: 06/09/2017 8:11 AM      [facility]

## 2017-06-09 NOTE — Transfer of Care (Signed)
Immediate Anesthesia Transfer of Care Note  Patient: Lindsey Lopez  Procedure(s) Performed: ESOPHAGOGASTRODUODENOSCOPY (EGD) WITH PROPOFOL (N/A )  Patient Location: Endoscopy Unit  Anesthesia Type:General  Level of Consciousness: sedated  Airway & Oxygen Therapy: Patient connected to nasal cannula oxygen  Post-op Assessment: Post -op Vital signs reviewed and stable  Post vital signs: stable  Last Vitals:  Vitals:   06/09/17 0830 06/09/17 0832  BP: 122/76   Pulse: 80   Resp: 18   Temp: (!) 36.2 C (!) 36.2 C  SpO2: 94%     Last Pain:  Vitals:   06/09/17 0832  TempSrc: Tympanic         Complications: No apparent anesthesia complications

## 2017-06-10 LAB — SURGICAL PATHOLOGY

## 2017-06-13 ENCOUNTER — Encounter: Payer: Self-pay | Admitting: Gastroenterology

## 2017-06-16 ENCOUNTER — Encounter: Payer: Self-pay | Admitting: Gastroenterology

## 2017-06-16 ENCOUNTER — Telehealth: Payer: Self-pay | Admitting: Neurology

## 2017-06-16 MED ORDER — LEVETIRACETAM 500 MG PO TABS: 500.0000 mg | ORAL_TABLET | Freq: Two times a day (BID) | ORAL | 0 refills | Status: DC

## 2017-06-16 MED ORDER — LEVETIRACETAM 500 MG PO TABS: 500.0000 mg | ORAL_TABLET | Freq: Two times a day (BID) | ORAL | 1 refills | Status: DC

## 2017-06-16 NOTE — Telephone Encounter (Signed)
Pt calling to inform that she is out of levETIRAcetam (KEPPRA) 500 MG tablet, she thought she had another bottle.  Pt is asking for a call  About this.  Pt states she took her last 2 on yesterday

## 2017-06-16 NOTE — Telephone Encounter (Signed)
Patient called, she states she has stopped using [facility] mail order. I removed this from her list. She now uses [facility] order pharmacy. I informed her that she has not followed up with Dr. Lucia Gaskins since 12/17 (3 mo f/u advised). She made a new appt for 06/29/17 @ 3:00 pm. She requested that a 1 month supply be sent to [facility] in [place] due to urgent need for Keppra while she waits for [facility] delivery. I have sent that prescription in addition to [facility] mail order pharmacy.

## 2017-06-16 NOTE — Addendum Note (Signed)
Addended by: Bertram Savin on: 06/16/2017 10:38 AM   Modules accepted: Orders

## 2017-06-20 ENCOUNTER — Other Ambulatory Visit: Payer: Self-pay

## 2017-06-20 DIAGNOSIS — R1310 Dysphagia, unspecified: Secondary | ICD-10-CM

## 2017-06-21 ENCOUNTER — Other Ambulatory Visit (HOSPITAL_COMMUNITY): Payer: Self-pay | Admitting: Gastroenterology

## 2017-06-21 DIAGNOSIS — R1310 Dysphagia, unspecified: Secondary | ICD-10-CM

## 2017-06-22 DIAGNOSIS — R69 Illness, unspecified: Secondary | ICD-10-CM | POA: Diagnosis not present

## 2017-06-28 ENCOUNTER — Ambulatory Visit (HOSPITAL_COMMUNITY)
Admission: RE | Admit: 2017-06-28 | Discharge: 2017-06-28 | Disposition: A | Discharge status: Home / Self Care | Payer: Medicare HMO | Source: Ambulatory Visit | Attending: Gastroenterology | Admitting: Gastroenterology

## 2017-06-28 DIAGNOSIS — R05 Cough: Secondary | ICD-10-CM | POA: Diagnosis not present

## 2017-06-28 DIAGNOSIS — R1310 Dysphagia, unspecified: Secondary | ICD-10-CM | POA: Insufficient documentation

## 2017-06-29 ENCOUNTER — Ambulatory Visit (INDEPENDENT_AMBULATORY_CARE_PROVIDER_SITE_OTHER): Payer: Medicare HMO | Admitting: Neurology

## 2017-06-29 ENCOUNTER — Encounter: Payer: Self-pay | Admitting: Neurology

## 2017-06-29 ENCOUNTER — Other Ambulatory Visit: Payer: Self-pay

## 2017-06-29 ENCOUNTER — Telehealth: Payer: Self-pay

## 2017-06-29 VITALS — BP 133/82 | HR 81 | Wt 170.4 lb

## 2017-06-29 DIAGNOSIS — R51 Headache with orthostatic component, not elsewhere classified: Secondary | ICD-10-CM

## 2017-06-29 DIAGNOSIS — J383 Other diseases of vocal cords: Secondary | ICD-10-CM

## 2017-06-29 DIAGNOSIS — R519 Headache, unspecified: Secondary | ICD-10-CM

## 2017-06-29 NOTE — Telephone Encounter (Signed)
LVM with patient to let her know her referral is in progress with [facility] they will contact her with an appt. Thanks [employer]

## 2017-06-29 NOTE — Progress Notes (Signed)
[facility]    Provider:  Dr Lucia Gaskins Referring Provider: [name], NP Primary Care Physician:  [name], NP  CC: Loss of consciousness  Interval history : She is still on Keppra, no seizures, headaches resolved but now new headache, occipital on the left, started this past weekend, it woke her up in the middle of the night, there all the time. Continuous. Lasted 3 days today may be a little better. Felt like "it was there", not throbbing, more of an ache. Irritating. Feels like it is gone but it comes back. No other symptoms. No other focal neurologic deficits, associated symptoms, inciting events or modifiable factors. Not tender to touch. No vision changes. Positional, wakes her at night. New onset 3 days ago.   Interval history 08/10/2016: She has headaches, bilateral temples and back of the head, pounding and throbbing, some nausea, she doesn't take anything for the headache. She has headaches 2x a week, they are caused by stress. Headaches started in may when mother moved in She is using her cpap regularly now, they changed the mask and has the pillow and she wears it every night. No side effects from the Keppra. Discussed stress management, can take ocassional OTC medication for the headaches. Do not sound migrainous. No light or sound sensitivity.   Interval update 08/11/2015: She has some "episodes" since being seen. She was overwhelmed by her son's wedding and by the end of the night she couldn't think, she had to leave, going home she kept asking questions about the wedding and she couldn't remember, she gets disoriented. She did eventually remember everything but she needs to be careful not to overdue it. She remembers telling her grandson she felt lost, no altered mentation. She naps daily. This is new, started in June. She has been fine on the cpap. She is due in June for follow up with Dr. Vickey Huger. [name] has had a few panic attacks. Not very often. They went to  [facility] and she had a pamnic attack, they had to lay her down on the floor and stop the ride. She has had two this year. The panic attack can last   Interval Update 02/06/2015:   She is doing well. Tolerating Keppra well. No further episodes of seizure-like activity. On the And feels better. Headaches are better. She is having vertigo issues, spinning sensation on head turning. Dix hallpike is negative however sounds like BPPV. Has decreased hearing as well so will refer for ENT consult.   HPI: Lindsey Lopez is a 71 y.o. female here as a referral from Dr. Shayne Alken for loss of consciousness. They drove to [place], on the 19th [place] to [place] [employer]. She was standing in line for a show and she felt like she was falling backwards. She was out for 10-15 minutes, totally blacked out. Husband provides most of information. Eyes closed. Everything turned black. No abnormal movements, loss of bowel or blader. She just laid there. There was no response. She was breathing, she has no recollection of events. Gradually she began to open her eyes but couldn't respond. A nurse in the crowd helped, and the more the nurse worked with her, the more responsive she became. Right arm and right leg weren't moving as well as the left side. The right side of the face drooped. After 15 minutes she responded better, they sat her up, This is the first time, never happened before. No inciting factors. She slowly got better, was very tired afterwards, slept  for 2 days. She sometimes has some word-finding difficulties otherwise back to normal. She is on aspirin now (was not before). She is on a statin. No diabetes. There were no new medications or other inciting events. EEg slowed bitemporal slowing. No personal or FHx of seizures. Never had a stroke.   Headaches are every day. Bitemporal pressure. She is very tense. Headache wake her up in the middle of the night. When she rolls on her back she starts breathing through her mouth,  significant snoring to the point she wakes herself. She is excessively tired during the day (however Epworth scale only 2). She takes naps often. She has gained 25-30 pounds recently. She has witnessed apneic events by husband. Wakes up with headaches. Daily headaches.   Reviewed notes, labs and imaging from outside physicians, which showed: MRI of the brain w/wo contrast unremarkable with some non-specific white matter periventricular increased signal, no infarct or mass or bleed, normal architecture, MRi of the cervical spine with postoperative changes with anterior cervical diskectomy and instrumentation c3-c7, solid fusion c3-c5, stenosis at c7-t1 and t1-t2 without cord flattening, no fracture or cord lesion. MRA neck w/wo: no significant stenosis interna carotid arteries or vertebral arteries. MRA head, normal. TEE with normal EF, abnormal left vent diastolic dysfunction, mild MR, trace TR, left vent nml size. Carotid duplex without hemodynamically significant stenosis in the bilat common, internal and external carotid arteries. EEG abnormal, intermittent bitemporal slpwing without interictal or epileptiform abnormalities.   Review of Systems: Patient complains of symptoms per HPI as well as the following symptoms: Memory loss. Headache, choking, coughing, choking. Pertinent negatives per HPI. All others negative.  Social History   Social History  . Marital status: Married    Spouse name: Lindsey Lopez   . Number of children: 1  . Years of education: College   Occupational History  . Retired    Social History Main Topics  . Smoking status: Never Smoker  . Smokeless tobacco: Never Used  . Alcohol use No  . Drug use: No  . Sexual activity: Not on file   Other Topics Concern  . Not on file   Social History Narrative   Lives at home with husband, Lindsey Lopez.   She is retired.    Has one child   Caffeine use: 1 cup a day    Family History  Problem Relation Age of Onset  . Cancer  Mother   . Heart attack Mother   . Cancer Father   . Heart attack Father   . Prostate cancer Brother   . Heart attack Maternal Grandmother   . Heart attack Maternal Grandfather     Past Medical History:  Diagnosis Date  . Anxiety   . CVA (cerebral vascular accident) (HCC) 09/24/2014   TPA reversed the symptoms  . High cholesterol   . Seizure [facility](HCC)     Past Surgical History:  Procedure Laterality Date  . BACK SURGERY  07-06-10   "Drill out" surgery on lower back  . ESOPHAGOGASTRODUODENOSCOPY (EGD) WITH PROPOFOL N/A 06/09/2017   Procedure: ESOPHAGOGASTRODUODENOSCOPY (EGD) WITH PROPOFOL;  Surgeon: Toney ReilVanga, Rohini Reddy, MD;  Location: [facility] ENDOSCOPY;  Service: Gastroenterology;  Laterality: N/A;  . Herniated disc  01-2000  . Neck fusion  04-03-10   4 leve     Current Outpatient Prescriptions  Medication Sig Dispense Refill  . amitriptyline (ELAVIL) 25 MG tablet Take 25 mg by mouth as needed.    Marland Kitchen. aspirin EC 81 MG tablet Take 81 mg by  mouth daily.    . Calcium-Magnesium-Vitamin D (CALCIUM 1200+D3 PO) Take by mouth.    . citalopram (CELEXA) 20 MG tablet Take 10 mg by mouth 2 (two) times daily.     . cyclobenzaprine (FLEXERIL) 10 MG tablet Take 10 mg by mouth daily.    Marland Kitchen esomeprazole (NEXIUM) 20 MG capsule Take 20 mg by mouth daily at 12 noon.    . fluvastatin XL (LESCOL XL) 80 MG 24 hr tablet Take 80 mg by mouth daily.    . Lansoprazole (PREVACID PO) Take 1 tablet by mouth daily.    Marland Kitchen levETIRAcetam (KEPPRA) 500 MG tablet Take 1 tablet (500 mg total) by mouth 2 (two) times daily. 180 tablet 1  . levETIRAcetam (KEPPRA) 500 MG tablet Take 1 tablet (500 mg total) by mouth 2 (two) times daily. 60 tablet 0  . meloxicam (MOBIC) 15 MG tablet Take 15 mg by mouth daily.    . metoprolol succinate (TOPROL-XL) 25 MG 24 hr tablet Take 12.5 mg by mouth daily.    Marland Kitchen triamcinolone cream (KENALOG) 0.1 %     . valACYclovir (VALTREX) 500 MG tablet      No current facility-administered medications for  this visit.     Allergies as of 06/29/2017 - Review Complete 06/09/2017  Allergen Reaction Noted  . Codeine Anaphylaxis 10/08/2014  . Phenobarbital  10/08/2014  . Ultram [tramadol]  10/08/2014  . Morphine and related Other (See Comments) 06/09/2017    Vitals: LMP 06/12/1989 (Exact Date)  Last Weight:  Wt Readings from Last 1 Encounters:  06/09/17 164 lb (74.4 kg)   Last Height:   Ht Readings from Last 1 Encounters:  06/09/17 5\' 4"  (1.626 m)   Physical exam: Exam: Gen: NAD, conversant, well nourised, well groomed                     Eyes: Conjunctivae clear without exudates or hemorrhage  Neuro: Detailed Neurologic Exam  Speech:    Speech is normal; fluent and spontaneous with normal comprehension.  Cognition:    The patient is oriented to person, place, and time;     Cranial Nerves:    The pupils are equal, round, and reactive to light. Visual fields are full to finger confrontation. Extraocular movements are intact. Trigeminal sensation is intact and the muscles of mastication are normal. The face is symmetric. The palate elevates in the midline. Hearing intact. Voice is normal. Shoulder shrug is normal. The tongue has normal motion without fasciculations.   Motor Observation:    No asymmetry, no atrophy, and no involuntary movements noted. Tone:    Normal muscle tone.    Posture:    Posture is normal. normal erect    Strength:    Strength is V/V in the upper and lower limbs.      Sensation: intact to L    Assessment/Plan: 71 year old female with loss of consciousness, right-sided weakness, migraines, panic attacks, anxiety, fatigue. 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 No electrographic seizures were recorded, however.) Personally reviewed EEG and agree with findings. Will continue AED.  Here for a new problem, left occipital head pain, new headache, continuous, positional: occipital headache, new onset, positional - Need  MRI brain to evaluate for space occupying lesion or stroke  Seizure: Continue Keppra 500mg  bid. Advised no driving until 6 months - 1 year seizure free based on locality laws, do not bathe or swim alone or do anything that could cause harm should you have a seizure. Follow up in 3 months.   Fatigue - better after being on CPAP for severe OSA. Continue cpap.. She has occ stress headaches, can treat with OTC meds and stress management.  Continue ASA 81 and statin for stroke prevention. Follow closely with pcp for management of vascular risk factors. Had extensive cardiology wkup with Dr. Graciela Husbands  Panic attacks: Diazepam 2mg  as needed  Orders Placed This Encounter  Procedures  . MR BRAIN W WO CONTRAST  . Basic Metabolic Panel    Naomie Dean, MD  [facility] [address] [place], [place] [address]  Phone [phone] Fax [phone]  A total of 30 minutes was spent in with this patient. Over half this time was spent on counseling patient on the occipital headache diagnosis and different therapeutic options available.

## 2017-06-29 NOTE — Patient Instructions (Addendum)
Mri brain w/wo contrast  Occipital Neuralgia Occipital neuralgia is a type of headache that causes episodes of very bad pain in the back of your head. Pain from occipital neuralgia may spread (radiate) to other parts of your head. The pain is usually brief and often goes away after you rest and relax. These headaches may be caused by irritation of the nerves that leave your spinal cord high up in your neck, just below the base of your skull (occipital nerves). Your occipital nerves transmit sensations from the back of your head, the top of your head, and the areas behind your ears. What are the causes? Occipital neuralgia can occur without any known cause (primary headache syndrome). In other cases, occipital neuralgia is caused by pressure on or irritation of one of the two occipital nerves. Causes of occipital nerve compression or irritation include:  Wear and tear of the vertebrae in the neck (osteoarthritis).  Neck injury.  Disease of the disks that separate the vertebrae.  Tumors.  Gout.  Infections.  Diabetes.  Swollen blood vessels that put pressure on the occipital nerves.  Muscle spasm in the neck.  What are the signs or symptoms? Pain is the main symptom of occipital neuralgia. It usually starts in the back of the head but may also be felt in other areas supplied by the occipital nerves. Pain is usually on one side but may be on both sides. You may have:  Brief episodes of very bad pain that is burning, stabbing, shocking, or shooting.  Pain behind the eye.  Pain triggered by neck movement or hair brushing.  Scalp tenderness.  Aching in the back of the head between episodes of very bad pain.  How is this diagnosed? Your health care provider may diagnose occipital neuralgia based on your symptoms and a physical exam. During the exam, the health care provider may push on areas supplied by the occipital nerves to see if they are painful. Some tests may also be done to  help in making the diagnosis. These may include:  Imaging studies of the upper spinal cord, such as an MRI or CT scan. These may show compression or spinal cord abnormalities.  Nerve block. You will get an injection of numbing medicine (local anesthetic) near the occipital nerve to see if this relieves pain.  How is this treated? Treatment may begin with simple measures, such as:  Rest.  Massage.  Heat.  Over-the-counter pain relievers.  If these measures do not work, you may need other treatments, including:  Medicines such as: ? Prescription-strength anti-inflammatory medicines. ? Muscle relaxants. ? Antiseizure medicines. ? Antidepressants.  Steroid injection. This involves injections of local anesthetic and strong anti-inflammatory drugs (steroids).  Pulsed radiofrequency. Wires are implanted to deliver electrical impulses that block pain signals from the occipital nerve.  Physical therapy.  Surgery to relieve nerve pressure.  Follow these instructions at home:  Take all medicines as directed by your health care provider.  Avoid activities that cause pain.  Rest when you have an attack of pain.  Try gentle massage or a heating pad to relieve pain.  Work with a physical therapist to learn stretching exercises you can do at home.  Try a different pillow or sleeping position.  Practice good posture.  Try to stay active. Get regular exercise that does not cause pain. Ask your health care provider to suggest safe exercises for you.  Keep all follow-up visits as directed by your health care provider. This is important. Contact  a health care provider if:  Your medicine is not working.  You have new or worsening symptoms. Get help right away if:  You have very bad head pain that is not going away.  You have a sudden change in vision, balance, or speech. This information is not intended to replace advice given to you by your health care provider. Make sure you  discuss any questions you have with your health care provider. Document Released: 08/17/2001 Document Revised: 01/29/2016 Document Reviewed: 08/15/2013 Elsevier Interactive Patient Education  2017 Elsevier Inc.  Occipital Nerve Block Patient Information  Description: The occipital nerves originate in the cervical (neck) spinal cord and travel upward through muscle and tissue to supply sensation to the back of the head and top of the scalp.  In addition, the nerves control some of the muscles of the scalp.  Occipital neuralgia is an irritation of these nerves which can cause headaches, numbness of the scalp, and neck discomfort.     The occipital nerve block will interrupt nerve transmission through these nerves and can relieve pain and spasm.  The block consists of insertion of a small needle under the skin in the back of the head to deposit local anesthetic (numbing medicine) and/or steroids around the nerve.  The entire block usually lasts less than 5 minutes.  Conditions which may be treated by occipital blocks:   Muscular pain and spasm of the scalp  Nerve irritation, back of the head  Headaches  Upper neck pain  Preparation for the injection:  1. Do not eat any solid food or dairy products within 8 hours of your appointment. 2. You may drink clear liquids up to 3 hours before appointment.  Clear liquids include water, black coffee, juice or soda.  No milk or cream please. 3. You may take your regular medication, including pain medications, with a sip of water before you appointment.  Diabetics should hold regular insulin (if taken separately) and take 1/2 normal NPH dose the morning of the procedure.  Carry some sugar containing items with you to your appointment. 4. A driver must accompany you and be prepared to drive you home after your procedure. 5. Bring all your current medications with you. 6. An IV may be inserted and sedation may be given at the discretion of the  physician. 7. A blood pressure cuff, EKG, and other monitors will often be applied during the procedure.  Some patients may need to have extra oxygen administered for a short period. 8. You will be asked to provide medical information, including your allergies and medications, prior to the procedure.  We must know immediately if you are taking blood thinners (like Coumadin/Warfarin) or if you are allergic to IV iodine contrast (dye).  We must know if you could possible be pregnant.  9. Do not wear a high collared shirt or turtleneck.  Tie long hair up in the back if possible.  Possible side-effects:   Bleeding from needle site  Infection (rare, may require surgery)  Nerve injury (rare)  Hair on back of neck can be tinged with iodine scrub (this will wash out)  Light-headedness (temporary)  Pain at injection site (several days)  Decreased blood pressure (rare, temporary)  Seizure (very rare)  Call if you experience:   Hives or difficulty breathing ( go to the emergency room)  Inflammation or drainage at the injection site(s)  Please note:  Although the local anesthetic injected can often make your painful muscles or headache feel good for  several hours after the injection, the pain may return.  It takes 3-7 days for steroids to work.  You may not notice any pain relief for at least one week.  If effective, we will often do a series of injections spaced 3-6 weeks apart to maximally decrease your pain.  If you have any questions, please call [phone] [facility]

## 2017-07-01 LAB — BASIC METABOLIC PANEL
BUN/Creatinine Ratio: 12 (ref 12–28)
BUN: 10 mg/dL (ref 8–27)
CALCIUM: 9.2 mg/dL (ref 8.7–10.3)
CO2: 25 mmol/L (ref 20–29)
CREATININE: 0.85 mg/dL (ref 0.57–1.00)
Chloride: 103 mmol/L (ref 96–106)
GFR calc Af Amer: 80 mL/min/{1.73_m2} (ref >59)
GFR calc non Af Amer: 70 mL/min/{1.73_m2} (ref >59)
GLUCOSE: 137 mg/dL — AB (ref 65–99)
Potassium: 4.3 mmol/L (ref 3.5–5.2)
Sodium: 142 mmol/L (ref 134–144)

## 2017-07-04 ENCOUNTER — Telehealth: Payer: Self-pay | Admitting: *Deleted

## 2017-07-04 NOTE — Telephone Encounter (Signed)
Called and LVM (ok per DPR) informing patient that labs are unremarkable except for elevated Glucose 137. I suggested she follow up with PCP. I left the office # and asked for a call back if she had any questions.

## 2017-07-04 NOTE — Telephone Encounter (Addendum)
Lauren/[facility] [phone] called wanted to know glucose levels, said the pt had called them. I relayed glucose was 137. She was appreciative   Lorain ChildesFYI

## 2017-07-04 NOTE — Telephone Encounter (Signed)
Noted  

## 2017-07-04 NOTE — Telephone Encounter (Signed)
-----   Message from Anson FretAntonia B Ahern, MD sent at 07/01/2017  9:47 PM EDT ----- Labs unremarkable except elevated glucose

## 2017-07-11 ENCOUNTER — Ambulatory Visit
Admission: RE | Admit: 2017-07-11 | Discharge: 2017-07-11 | Disposition: A | Discharge status: Home / Self Care | Payer: Medicare HMO | Source: Ambulatory Visit | Attending: Neurology | Admitting: Neurology

## 2017-07-11 DIAGNOSIS — R51 Headache with orthostatic component, not elsewhere classified: Secondary | ICD-10-CM

## 2017-07-11 DIAGNOSIS — R519 Headache, unspecified: Secondary | ICD-10-CM

## 2017-07-11 MED ORDER — GADOBENATE DIMEGLUMINE 529 MG/ML IV SOLN
15.0000 mL | Freq: Once | INTRAVENOUS | Status: AC | PRN
  Administered 2017-07-11: 15 mL via INTRAVENOUS

## 2017-07-12 DIAGNOSIS — K219 Gastro-esophageal reflux disease without esophagitis: Secondary | ICD-10-CM | POA: Diagnosis not present

## 2017-07-12 DIAGNOSIS — R682 Dry mouth, unspecified: Secondary | ICD-10-CM | POA: Diagnosis not present

## 2017-07-12 DIAGNOSIS — R490 Dysphonia: Secondary | ICD-10-CM | POA: Diagnosis not present

## 2017-07-13 ENCOUNTER — Telehealth: Payer: Self-pay | Admitting: *Deleted

## 2017-07-13 NOTE — Telephone Encounter (Signed)
Called and LVM (ok per DPR) informing patient that her MRI of her brain is unremarkable. I left our office number if she had any questions and stated that this message does not require a call back.

## 2017-07-13 NOTE — Telephone Encounter (Signed)
-----   Message from Anson FretAntonia B Ahern, MD sent at 07/12/2017  6:34 PM EST ----- MRI of the brain unremarkable thanks

## 2017-09-08 DIAGNOSIS — R490 Dysphonia: Secondary | ICD-10-CM | POA: Diagnosis not present

## 2017-10-01 DIAGNOSIS — S199XXA Unspecified injury of neck, initial encounter: Secondary | ICD-10-CM | POA: Diagnosis not present

## 2017-10-01 DIAGNOSIS — S299XXA Unspecified injury of thorax, initial encounter: Secondary | ICD-10-CM | POA: Diagnosis not present

## 2017-10-01 DIAGNOSIS — M545 Low back pain: Secondary | ICD-10-CM | POA: Diagnosis not present

## 2017-10-01 DIAGNOSIS — M25531 Pain in right wrist: Secondary | ICD-10-CM | POA: Diagnosis not present

## 2017-10-01 DIAGNOSIS — S3992XA Unspecified injury of lower back, initial encounter: Secondary | ICD-10-CM | POA: Diagnosis not present

## 2017-10-01 DIAGNOSIS — M25511 Pain in right shoulder: Secondary | ICD-10-CM | POA: Diagnosis not present

## 2017-10-01 DIAGNOSIS — R51 Headache: Secondary | ICD-10-CM | POA: Diagnosis not present

## 2017-10-01 DIAGNOSIS — M549 Dorsalgia, unspecified: Secondary | ICD-10-CM | POA: Diagnosis not present

## 2017-10-01 DIAGNOSIS — S0990XA Unspecified injury of head, initial encounter: Secondary | ICD-10-CM | POA: Diagnosis not present

## 2017-10-01 DIAGNOSIS — M25551 Pain in right hip: Secondary | ICD-10-CM | POA: Diagnosis not present

## 2017-10-01 DIAGNOSIS — S335XXA Sprain of ligaments of lumbar spine, initial encounter: Secondary | ICD-10-CM | POA: Diagnosis not present

## 2017-10-03 ENCOUNTER — Telehealth: Payer: Self-pay | Admitting: Neurology
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
## 2017-10-05 DIAGNOSIS — S139XXA Sprain of joints and ligaments of unspecified parts of neck, initial encounter: Secondary | ICD-10-CM | POA: Diagnosis not present

## 2017-10-05 DIAGNOSIS — S335XXA Sprain of ligaments of lumbar spine, initial encounter: Secondary | ICD-10-CM | POA: Diagnosis not present

## 2017-10-19 ENCOUNTER — Ambulatory Visit: Payer: Medicare HMO | Admitting: Physical Therapy

## 2017-10-21 ENCOUNTER — Encounter: Payer: Self-pay | Admitting: *Deleted

## 2017-10-21 ENCOUNTER — Emergency Department: Payer: Medicare HMO

## 2017-10-21 ENCOUNTER — Emergency Department
Admission: EM | Admit: 2017-10-21 | Discharge: 2017-10-21 | Disposition: A | Discharge status: Home / Self Care | Payer: Medicare HMO | Attending: Emergency Medicine | Admitting: Emergency Medicine

## 2017-10-21 ENCOUNTER — Other Ambulatory Visit: Payer: Self-pay

## 2017-10-21 DIAGNOSIS — R079 Chest pain, unspecified: Secondary | ICD-10-CM | POA: Diagnosis present

## 2017-10-21 DIAGNOSIS — Z5321 Procedure and treatment not carried out due to patient leaving prior to being seen by health care provider: Secondary | ICD-10-CM | POA: Insufficient documentation

## 2017-10-21 DIAGNOSIS — R0602 Shortness of breath: Secondary | ICD-10-CM | POA: Insufficient documentation

## 2017-10-21 DIAGNOSIS — R1110 Vomiting, unspecified: Secondary | ICD-10-CM | POA: Insufficient documentation

## 2017-10-21 DIAGNOSIS — R112 Nausea with vomiting, unspecified: Secondary | ICD-10-CM | POA: Diagnosis not present

## 2017-10-21 LAB — TROPONIN I: Troponin I: <0.03 ng/mL (ref <0.03)

## 2017-10-21 LAB — BASIC METABOLIC PANEL
Anion gap: 10 (ref 5–15)
BUN: 14 mg/dL (ref 6–20)
CHLORIDE: 106 mmol/L (ref 101–111)
CO2: 24 mmol/L (ref 22–32)
Calcium: 9.3 mg/dL (ref 8.9–10.3)
Creatinine, Ser: 0.75 mg/dL (ref 0.44–1.00)
GFR calc non Af Amer: >60 mL/min (ref >60)
Glucose, Bld: 128 mg/dL — ABNORMAL HIGH (ref 65–99)
POTASSIUM: 4.4 mmol/L (ref 3.5–5.1)
Sodium: 140 mmol/L (ref 135–145)

## 2017-10-21 LAB — CBC
HEMATOCRIT: 41.8 % (ref 35.0–47.0)
Hemoglobin: 13.9 g/dL (ref 12.0–16.0)
MCH: 27.9 pg (ref 26.0–34.0)
MCHC: 33.4 g/dL (ref 32.0–36.0)
MCV: 83.5 fL (ref 80.0–100.0)
Platelets: 217 10*3/uL (ref 150–440)
RBC: 5 MIL/uL (ref 3.80–5.20)
RDW: 16.1 % — ABNORMAL HIGH (ref 11.5–14.5)
WBC: 11 10*3/uL (ref 3.6–11.0)

## 2017-10-21 MED ORDER — SODIUM CHLORIDE 0.9 % IV BOLUS (SEPSIS)
1000.0000 mL | Freq: Once | INTRAVENOUS | Status: AC
  Administered 2017-10-21: 1000 mL via INTRAVENOUS

## 2017-10-21 MED ORDER — ONDANSETRON HCL 4 MG/2ML IJ SOLN
4.0000 mg | Freq: Once | INTRAMUSCULAR | Status: AC
  Administered 2017-10-21: 4 mg via INTRAVENOUS
  Filled 2017-10-21: qty 2

## 2017-10-21 NOTE — ED Triage Notes (Signed)
Pt c/o sudden onset of n/v @ 1600 today that has not relented. Pt is presently having dry heaves in triage. Pt c/o sudden onset of chest pain at the same time. Pt states chest pain is generalized and feels tight. Pt c/o additional sxs shortness of breath. Pt a&ox 4, unable to ambulate due to wheezing.

## 2017-10-24 ENCOUNTER — Ambulatory Visit: Payer: Medicare HMO | Admitting: Physical Therapy

## 2017-10-25 DIAGNOSIS — R112 Nausea with vomiting, unspecified: Secondary | ICD-10-CM | POA: Diagnosis not present

## 2017-10-25 DIAGNOSIS — D509 Iron deficiency anemia, unspecified: Secondary | ICD-10-CM | POA: Diagnosis not present

## 2017-10-25 DIAGNOSIS — R69 Illness, unspecified: Secondary | ICD-10-CM | POA: Diagnosis not present

## 2017-10-25 DIAGNOSIS — G40909 Epilepsy, unspecified, not intractable, without status epilepticus: Secondary | ICD-10-CM | POA: Diagnosis not present

## 2017-10-25 DIAGNOSIS — E785 Hyperlipidemia, unspecified: Secondary | ICD-10-CM | POA: Diagnosis not present

## 2017-10-25 DIAGNOSIS — Z6827 Body mass index (BMI) 27.0-27.9, adult: Secondary | ICD-10-CM | POA: Diagnosis not present

## 2017-10-25 DIAGNOSIS — R0789 Other chest pain: Secondary | ICD-10-CM | POA: Diagnosis not present

## 2017-10-25 DIAGNOSIS — I10 Essential (primary) hypertension: Secondary | ICD-10-CM | POA: Diagnosis not present

## 2017-10-26 ENCOUNTER — Ambulatory Visit: Payer: Medicare HMO | Admitting: Physical Therapy

## 2017-11-01 ENCOUNTER — Ambulatory Visit: Payer: Medicare HMO | Admitting: Physical Therapy

## 2017-11-03 ENCOUNTER — Ambulatory Visit: Payer: Medicare HMO | Admitting: Physical Therapy

## 2017-11-08 ENCOUNTER — Ambulatory Visit: Payer: Medicare HMO | Admitting: Physical Therapy

## 2017-11-10 ENCOUNTER — Encounter: Payer: Self-pay | Admitting: Physical Therapy

## 2017-11-14 ENCOUNTER — Ambulatory Visit: Payer: Self-pay | Admitting: Neurology

## 2017-11-29 ENCOUNTER — Encounter: Payer: Self-pay | Admitting: Adult Health

## 2017-12-17 ENCOUNTER — Other Ambulatory Visit: Payer: Self-pay | Admitting: Neurology

## 2018-01-25 DIAGNOSIS — M25511 Pain in right shoulder: Secondary | ICD-10-CM | POA: Diagnosis not present

## 2018-01-25 DIAGNOSIS — M7541 Impingement syndrome of right shoulder: Secondary | ICD-10-CM | POA: Diagnosis not present

## 2018-02-15 ENCOUNTER — Telehealth: Payer: Self-pay | Admitting: Neurology

## 2018-02-15 NOTE — Telephone Encounter (Signed)
Pt has called to inform that for the 1st time ever in her life she woke up from jerking in her sleep.  Pt would like a call back to discuss further with RN

## 2018-02-15 NOTE — Telephone Encounter (Signed)
Spoke with patient. She is under stress at this time. Pt is having to come up with money to get a live-in caregiver for her mother. She has been waking up with headaches. Last night, she woke up with her body jerking into and out of the fetal position and within seconds after waking it stopped. She also had a "cluster" headache at the skull base that has moved around to the front and is now a regular stress headache. She fell back asleep but the jerking happened again an hour later. She took a 2 hr nap and has felt exceptionally tired, but has gone about her day as normal. She is taking all of her medications as prescribed. She reports h/o RLS but not whole body jerks to fetal position. She is wondering if she needs to wait until this "storm" is over and if this related to stress or if she needs to be seen in office. Pt prefers to see how things go. She was advised to call 911 with any emergencies or concerns. Will return pt's call. Pt appreciative.

## 2018-02-15 NOTE — Telephone Encounter (Signed)
D/w Dr. Lucia GaskinsAhern. Per MD, does sound like stress however f/u with PCP.   Called pt and informed her of Dr. Trevor MaceAhern's thoughts and advice to f/u with primary care and this does sound like stress. She was also advised to call 911, go to hospital with any seizure activity or concerning symptoms. She verbalized understanding and appreciation.

## 2018-03-22 ENCOUNTER — Other Ambulatory Visit: Payer: Self-pay | Admitting: Neurology

## 2018-04-05 ENCOUNTER — Ambulatory Visit: Payer: Self-pay | Admitting: Adult Health

## 2018-05-03 DIAGNOSIS — R69 Illness, unspecified: Secondary | ICD-10-CM | POA: Diagnosis not present

## 2018-05-23 DIAGNOSIS — H524 Presbyopia: Secondary | ICD-10-CM | POA: Diagnosis not present

## 2018-06-08 DIAGNOSIS — Z0100 Encounter for examination of eyes and vision without abnormal findings: Secondary | ICD-10-CM | POA: Diagnosis not present

## 2018-06-09 ENCOUNTER — Other Ambulatory Visit: Payer: Self-pay | Admitting: Neurology

## 2018-07-26 DIAGNOSIS — M7521 Bicipital tendinitis, right shoulder: Secondary | ICD-10-CM | POA: Diagnosis not present

## 2018-07-26 DIAGNOSIS — M7541 Impingement syndrome of right shoulder: Secondary | ICD-10-CM | POA: Diagnosis not present

## 2018-07-27 DIAGNOSIS — M7541 Impingement syndrome of right shoulder: Secondary | ICD-10-CM | POA: Diagnosis not present

## 2018-07-27 DIAGNOSIS — M25511 Pain in right shoulder: Secondary | ICD-10-CM | POA: Diagnosis not present

## 2018-07-27 DIAGNOSIS — M7521 Bicipital tendinitis, right shoulder: Secondary | ICD-10-CM | POA: Diagnosis not present

## 2018-07-28 DIAGNOSIS — I10 Essential (primary) hypertension: Secondary | ICD-10-CM | POA: Diagnosis not present

## 2018-07-28 DIAGNOSIS — Z87898 Personal history of other specified conditions: Secondary | ICD-10-CM | POA: Diagnosis not present

## 2018-07-28 DIAGNOSIS — D649 Anemia, unspecified: Secondary | ICD-10-CM | POA: Diagnosis not present

## 2018-07-28 DIAGNOSIS — R1310 Dysphagia, unspecified: Secondary | ICD-10-CM | POA: Diagnosis not present

## 2018-07-28 DIAGNOSIS — E611 Iron deficiency: Secondary | ICD-10-CM | POA: Diagnosis not present

## 2018-07-28 DIAGNOSIS — Z13220 Encounter for screening for lipoid disorders: Secondary | ICD-10-CM | POA: Diagnosis not present

## 2018-07-28 DIAGNOSIS — Z9989 Dependence on other enabling machines and devices: Secondary | ICD-10-CM | POA: Diagnosis not present

## 2018-07-28 DIAGNOSIS — K219 Gastro-esophageal reflux disease without esophagitis: Secondary | ICD-10-CM | POA: Diagnosis not present

## 2018-07-28 DIAGNOSIS — Z8673 Personal history of transient ischemic attack (TIA), and cerebral infarction without residual deficits: Secondary | ICD-10-CM | POA: Diagnosis not present

## 2018-07-28 DIAGNOSIS — G4733 Obstructive sleep apnea (adult) (pediatric): Secondary | ICD-10-CM | POA: Diagnosis not present

## 2018-08-15 DIAGNOSIS — M7918 Myalgia, other site: Secondary | ICD-10-CM | POA: Diagnosis not present

## 2018-08-15 DIAGNOSIS — M549 Dorsalgia, unspecified: Secondary | ICD-10-CM | POA: Diagnosis not present

## 2018-08-15 DIAGNOSIS — M5442 Lumbago with sciatica, left side: Secondary | ICD-10-CM | POA: Diagnosis not present

## 2018-08-15 DIAGNOSIS — R202 Paresthesia of skin: Secondary | ICD-10-CM | POA: Diagnosis not present

## 2018-08-15 DIAGNOSIS — G8929 Other chronic pain: Secondary | ICD-10-CM | POA: Diagnosis not present

## 2018-08-15 DIAGNOSIS — M545 Low back pain: Secondary | ICD-10-CM | POA: Diagnosis not present

## 2018-08-15 DIAGNOSIS — M5441 Lumbago with sciatica, right side: Secondary | ICD-10-CM | POA: Diagnosis not present

## 2018-08-15 DIAGNOSIS — M542 Cervicalgia: Secondary | ICD-10-CM | POA: Diagnosis not present

## 2018-08-25 DIAGNOSIS — R748 Abnormal levels of other serum enzymes: Secondary | ICD-10-CM | POA: Diagnosis not present

## 2018-08-25 DIAGNOSIS — R202 Paresthesia of skin: Secondary | ICD-10-CM | POA: Diagnosis not present

## 2018-08-25 DIAGNOSIS — M47812 Spondylosis without myelopathy or radiculopathy, cervical region: Secondary | ICD-10-CM | POA: Diagnosis not present

## 2018-08-25 DIAGNOSIS — G8929 Other chronic pain: Secondary | ICD-10-CM | POA: Diagnosis not present

## 2018-08-25 DIAGNOSIS — Z981 Arthrodesis status: Secondary | ICD-10-CM | POA: Diagnosis not present

## 2018-08-25 DIAGNOSIS — M542 Cervicalgia: Secondary | ICD-10-CM | POA: Diagnosis not present

## 2018-08-25 DIAGNOSIS — M48061 Spinal stenosis, lumbar region without neurogenic claudication: Secondary | ICD-10-CM | POA: Diagnosis not present

## 2018-08-25 DIAGNOSIS — M5441 Lumbago with sciatica, right side: Secondary | ICD-10-CM | POA: Diagnosis not present

## 2018-08-25 DIAGNOSIS — M47816 Spondylosis without myelopathy or radiculopathy, lumbar region: Secondary | ICD-10-CM | POA: Diagnosis not present

## 2018-08-25 DIAGNOSIS — M5127 Other intervertebral disc displacement, lumbosacral region: Secondary | ICD-10-CM | POA: Diagnosis not present

## 2018-08-25 DIAGNOSIS — D72825 Bandemia: Secondary | ICD-10-CM | POA: Diagnosis not present

## 2018-08-25 DIAGNOSIS — M5442 Lumbago with sciatica, left side: Secondary | ICD-10-CM | POA: Diagnosis not present

## 2018-09-13 ENCOUNTER — Other Ambulatory Visit: Payer: Self-pay | Admitting: Neurology

## 2018-09-15 ENCOUNTER — Ambulatory Visit: Payer: Medicare HMO | Admitting: Neurology

## 2018-09-15 ENCOUNTER — Encounter: Payer: Self-pay | Admitting: Neurology

## 2018-09-15 VITALS — BP 122/78 | HR 66 | Ht 64.5 in | Wt 166.0 lb

## 2018-09-15 DIAGNOSIS — I639 Cerebral infarction, unspecified: Secondary | ICD-10-CM | POA: Diagnosis not present

## 2018-09-15 DIAGNOSIS — R2689 Other abnormalities of gait and mobility: Secondary | ICD-10-CM | POA: Diagnosis not present

## 2018-09-15 DIAGNOSIS — R42 Dizziness and giddiness: Secondary | ICD-10-CM | POA: Diagnosis not present

## 2018-09-15 NOTE — Patient Instructions (Addendum)
MRI of the brain stroke protocol CTA of the head and neck blood vessels Continue ASA at this time EEG  Stroke Prevention Some medical conditions and behaviors are associated with a higher chance of having a stroke. You can help prevent a stroke by making nutrition, lifestyle, and other changes, including managing any medical conditions you may have. What nutrition changes can be made?   Eat healthy foods. You can do this by: ? Choosing foods high in fiber, such as fresh fruits and vegetables and whole grains. ? Eating at least 5 or more servings of fruits and vegetables a day. Try to fill half of your plate at each meal with fruits and vegetables. ? Choosing lean protein foods, such as lean cuts of meat, poultry without skin, fish, tofu, beans, and nuts. ? Eating low-fat dairy products. ? Avoiding foods that are high in salt (sodium). This can help lower blood pressure. ? Avoiding foods that have saturated fat, trans fat, and cholesterol. This can help prevent high cholesterol. ? Avoiding processed and premade foods.  Follow your health care provider's specific guidelines for losing weight, controlling high blood pressure (hypertension), lowering high cholesterol, and managing diabetes. These may include: ? Reducing your daily calorie intake. ? Limiting your daily sodium intake to 1,500 milligrams (mg). ? Using only healthy fats for cooking, such as olive oil, canola oil, or sunflower oil. ? Counting your daily carbohydrate intake. What lifestyle changes can be made?  Maintain a healthy weight. Talk to your health care provider about your ideal weight.  Get at least 30 minutes of moderate physical activity at least 5 days a week. Moderate activity includes brisk walking, biking, and swimming.  Do not use any products that contain nicotine or tobacco, such as cigarettes and e-cigarettes. If you need help quitting, ask your health care provider. It may also be helpful to avoid exposure to  secondhand smoke.  Limit alcohol intake to no more than 1 drink a day for nonpregnant women and 2 drinks a day for men. One drink equals 12 oz of beer, 5 oz of wine, or 1 oz of hard liquor.  Stop any illegal drug use.  Avoid taking birth control pills. Talk to your health care provider about the risks of taking birth control pills if: ? You are over 72 years old. ? You smoke. ? You get migraines. ? You have ever had a blood clot. What other changes can be made?  Manage your cholesterol levels. ? Eating a healthy diet is important for preventing high cholesterol. If cholesterol cannot be managed through diet alone, you may also need to take medicines. ? Take any prescribed medicines to control your cholesterol as told by your health care provider.  Manage your diabetes. ? Eating a healthy diet and exercising regularly are important parts of managing your blood sugar. If your blood sugar cannot be managed through diet and exercise, you may need to take medicines. ? Take any prescribed medicines to control your diabetes as told by your health care provider.  Control your hypertension. ? To reduce your risk of stroke, try to keep your blood pressure below 130/80. ? Eating a healthy diet and exercising regularly are an important part of controlling your blood pressure. If your blood pressure cannot be managed through diet and exercise, you may need to take medicines. ? Take any prescribed medicines to control hypertension as told by your health care provider. ? Ask your health care provider if you should monitor your blood  pressure at home. ? Have your blood pressure checked every year, even if your blood pressure is normal. Blood pressure increases with age and some medical conditions.  Get evaluated for sleep disorders (sleep apnea). Talk to your health care provider about getting a sleep evaluation if you snore a lot or have excessive sleepiness.  Take over-the-counter and prescription  medicines only as told by your health care provider. Aspirin or blood thinners (antiplatelets or anticoagulants) may be recommended to reduce your risk of forming blood clots that can lead to stroke.  Make sure that any other medical conditions you have, such as atrial fibrillation or atherosclerosis, are managed. What are the warning signs of a stroke? The warning signs of a stroke can be easily remembered as BEFAST.  B is for balance. Signs include: ? Dizziness. ? Loss of balance or coordination. ? Sudden trouble walking.  E is for eyes. Signs include: ? A sudden change in vision. ? Trouble seeing.  F is for face. Signs include: ? Sudden weakness or numbness of the face. ? The face or eyelid drooping to one side.  A is for arms. Signs include: ? Sudden weakness or numbness of the arm, usually on one side of the body.  S is for speech. Signs include: ? Trouble speaking (aphasia). ? Trouble understanding.  T is for time. ? These symptoms may represent a serious problem that is an emergency. Do not wait to see if the symptoms will go away. Get medical help right away. Call your local emergency services (911 in the [place] Do not drive yourself to the hospital.  Other signs of stroke may include: ? A sudden, severe headache with no known cause. ? Nausea or vomiting. ? Seizure. Where to find more information For more information, visit:  American Stroke Association: www.strokeassociation.org  National Stroke Association: www.stroke.org Summary  You can prevent a stroke by eating healthy, exercising, not smoking, limiting alcohol intake, and managing any medical conditions you may have.  Do not use any products that contain nicotine or tobacco, such as cigarettes and e-cigarettes. If you need help quitting, ask your health care provider. It may also be helpful to avoid exposure to secondhand smoke.  Remember BEFAST for warning signs of stroke. Get help right away if you or a  loved one has any of these signs. This information is not intended to replace advice given to you by your health care provider. Make sure you discuss any questions you have with your health care provider. Document Released: 09/30/2004 Document Revised: 09/28/2016 Document Reviewed: 09/28/2016 Elsevier Interactive Patient Education  2019 [employer].

## 2018-09-15 NOTE — Progress Notes (Addendum)
[facility]    Provider:  Dr Lucia Gaskins Referring Provider: [name], NP Primary Care Physician:  Tawanna Solo Sumit, DO  CC: Occipital stroke, new issue   Interval history 09/15/2018: This is a 73 year old patient who is here for a new issue.  She had cervical spine imaging completed at [facility] who told her there may be "brain restriction to the cerebellum".  I reviewed the report, I do not have the imaging to take a look at, the report stated that there is a small focal peripheral left cerebellar hemisphere infarction recommend comparison with prior brain imaging.  I reviewed report of MRI of the brain in 2016 which did not show this.  Reviewed images from 2018 which did not show the lesion. Patient is here today to discuss. She is still on the Keppra for a possible seizure.  We may consider weaning her off of the Keppra, has not had an episode. She has been having vertigo, imbalance.     Interval history : She is still on Keppra, no seizures, headaches resolved but now new headache, occipital on the left, started this past weekend, it woke her up in the middle of the night, there all the time. Continuous. Lasted 3 days today may be a little better. Felt like "it was there", not throbbing, more of an ache. Irritating. Feels like it is gone but it comes back. No other symptoms. No other focal neurologic deficits, associated symptoms, inciting events or modifiable factors. Not tender to touch. No vision changes. Positional, wakes her at night. New onset 3 days ago.   Interval history 08/10/2016: She has headaches, bilateral temples and back of the head, pounding and throbbing, some nausea, she doesn't take anything for the headache. She has headaches 2x a week, they are caused by stress. Headaches started in may when mother moved in She is using her cpap regularly now, they changed the mask and has the pillow and she wears it every night. No side effects from the Keppra. Discussed  stress management, can take ocassional OTC medication for the headaches. Do not sound migrainous. No light or sound sensitivity.   Interval update 08/11/2015: She has some "episodes" since being seen. She was overwhelmed by her son's wedding and by the end of the night she couldn't think, she had to leave, going home she kept asking questions about the wedding and she couldn't remember, she gets disoriented. She did eventually remember everything but she needs to be careful not to overdue it. She remembers telling her grandson she felt lost, no altered mentation. She naps daily. This is new, started in June. She has been fine on the cpap. She is due in June for follow up with Dr. Vickey Huger. [name] has had a few panic attacks. Not very often. They went to [facility] and she had a pamnic attack, they had to lay her down on the floor and stop the ride. She has had two this year. The panic attack can last   Interval Update 02/06/2015:   She is doing well. Tolerating Keppra well. No further episodes of seizure-like activity. On the And feels better. Headaches are better. She is having vertigo issues, spinning sensation on head turning. Dix hallpike is negative however sounds like BPPV. Has decreased hearing as well so will refer for ENT consult.   HPI: Lindsey Lopez is a 73 y.o. female here as a referral from Dr. Shayne Alken for loss of consciousness. They drove to [place], on the 19th [place]  [employer]. She was standing in line for a show and she felt like she was falling backwards. She was out for 10-15 minutes, totally blacked out. Husband provides most of information. Eyes closed. Everything turned black. No abnormal movements, loss of bowel or blader. She just laid there. There was no response. She was breathing, she has no recollection of events. Gradually she began to open her eyes but couldn't respond. A nurse in the crowd helped, and the more the nurse worked with her, the more responsive she became. Right  arm and right leg weren't moving as well as the left side. The right side of the face drooped. After 15 minutes she responded better, they sat her up, This is the first time, never happened before. No inciting factors. She slowly got better, was very tired afterwards, slept for 2 days. She sometimes has some word-finding difficulties otherwise back to normal. She is on aspirin now (was not before). She is on a statin. No diabetes. There were no new medications or other inciting events. EEg slowed bitemporal slowing. No personal or FHx of seizures. Never had a stroke.   Headaches are every day. Bitemporal pressure. She is very tense. Headache wake her up in the middle of the night. When she rolls on her back she starts breathing through her mouth, significant snoring to the point she wakes herself. She is excessively tired during the day (however Epworth scale only 2). She takes naps often. She has gained 25-30 pounds recently. She has witnessed apneic events by husband. Wakes up with headaches. Daily headaches.   Reviewed notes from [facility], labs and imaging from outside physicians, which showed: MRI of the brain w/wo contrast unremarkable with some non-specific white matter periventricular increased signal, no infarct or mass or bleed, normal architecture, MRi of the cervical spine with postoperative changes with anterior cervical diskectomy and instrumentation c3-c7, solid fusion c3-c5, stenosis at c7-t1 and t1-t2 without cord flattening, no fracture or cord lesion. MRA neck w/wo: no significant stenosis interna carotid arteries or vertebral arteries. MRA head, normal. TEE with normal EF, abnormal left vent diastolic dysfunction, mild MR, trace TR, left vent nml size. Carotid duplex without hemodynamically significant stenosis in the bilat common, internal and external carotid arteries. EEG abnormal, intermittent bitemporal slpwing without interictal or epileptiform abnormalities.   Review of  Systems: Patient complains of symptoms per HPI as well as the following symptoms: Memory loss, dizziness, depression, anxiety, joint pain, back pain, aching muscles, neck stiffness. Pertinent negatives per HPI. All others negative.  Social History   Socioeconomic History  . Marital status: Married    Spouse name: Harvie HeckRandy   . Number of children: 1  . Years of education: College  . Highest education level: Not on file  Occupational History  . Occupation: Retired  Engineer, productionocial Needs  . Financial resource strain: Not on file  . Food insecurity:    Worry: Not on file    Inability: Not on file  . Transportation needs:    Medical: Not on file    Non-medical: Not on file  Tobacco Use  . Smoking status: Never Smoker  . Smokeless tobacco: Never Used  Substance and Sexual Activity  . Alcohol use: No    Alcohol/week: 0.0 standard drinks  . Drug use: No  . Sexual activity: Not on file  Lifestyle  . Physical activity:    Days per week: Not on file    Minutes per session: Not on file  . Stress: Not on file  Relationships  . Social connections:    Talks on phone: Not on file    Gets together: Not on file    Attends religious service: Not on file    Active member of club or organization: Not on file    Attends meetings of clubs or organizations: Not on file    Relationship status: Not on file  . Intimate partner violence:    Fear of current or ex partner: Not on file    Emotionally abused: Not on file    Physically abused: Not on file    Forced sexual activity: Not on file  Other Topics Concern  . Not on file  Social History Narrative   Lives at home with husband, Marquite Torma.   She is retired.    Has one child   Caffeine use: 1 cup a day    Family History  Problem Relation Age of Onset  . Cancer Mother   . Heart attack Mother   . Cancer Father   . Heart attack Father   . Prostate cancer Brother   . Heart attack Maternal Grandmother   . Heart attack Maternal Grandfather      Past Medical History:  Diagnosis Date  . Anxiety   . CVA (cerebral vascular accident) (HCC) 09/24/2014   TPA reversed the symptoms  . Dysphagia    since choking in August 2016; receiving swallowing evaluations   . High cholesterol   . Seizure [facility])     Past Surgical History:  Procedure Laterality Date  . BACK SURGERY  07-06-10   "Drill out" surgery on lower back  . ESOPHAGOGASTRODUODENOSCOPY (EGD) WITH PROPOFOL N/A 06/09/2017   Procedure: ESOPHAGOGASTRODUODENOSCOPY (EGD) WITH PROPOFOL;  Surgeon: Toney Reil, MD;  Location: [facility] ENDOSCOPY;  Service: Gastroenterology;  Laterality: N/A;  . Herniated disc  01-2000  . Neck fusion  04-03-10   4 leve     Current Outpatient Medications  Medication Sig Dispense Refill  . aspirin EC 81 MG tablet Take 81 mg by mouth daily.    . Calcium-Magnesium-Vitamin D (CALCIUM 1200+D3 PO) Take by mouth.    . citalopram (CELEXA) 20 MG tablet Take 10 mg by mouth 2 (two) times daily.     . fluvastatin XL (LESCOL XL) 80 MG 24 hr tablet Take 80 mg by mouth daily.    Marland Kitchen gabapentin (NEURONTIN) 300 MG capsule Day 1-3: 300mg  QHS; Day 4-6: 300mg  BID; Day 7-9: 300 mg TID; Day 10 and on: 300/300/600mg     . levETIRAcetam (KEPPRA) 500 MG tablet Take 1 tablet (500 mg total) by mouth 2 (two) times daily. 180 tablet 0  . methocarbamol (ROBAXIN) 750 MG tablet Take by mouth.    . valACYclovir (VALTREX) 500 MG tablet      No current facility-administered medications for this visit.     Allergies as of 09/15/2018 - Review Complete 09/15/2018  Allergen Reaction Noted  . Codeine Anaphylaxis 10/08/2014  . Phenobarbital  10/08/2014  . Ultram [tramadol]  10/08/2014  . Morphine and related Other (See Comments) 06/09/2017    Vitals: BP 122/78   Pulse 66   Ht 5' 4.5" (1.638 m)   Wt 166 lb (75.3 kg)   LMP 06/12/1989 (Exact Date)   BMI 28.05 kg/m  Last Weight:  Wt Readings from Last 1 Encounters:  09/15/18 166 lb (75.3 kg)   Last Height:   Ht Readings  from Last 1 Encounters:  09/15/18 5' 4.5" (1.638 m)   Physical exam: Exam: Gen: NAD, conversant, well  nourised, well groomed                     Eyes: Conjunctivae clear without exudates or hemorrhage  Neuro: Detailed Neurologic Exam  Speech:    Speech is normal; fluent and spontaneous with normal comprehension.  Cognition:    The patient is oriented to person, place, and time;     Cranial Nerves:    The pupils are equal, round, and reactive to light. Visual fields are full to finger confrontation. Extraocular movements are intact. Trigeminal sensation is intact and the muscles of mastication are normal. The face is symmetric. The palate elevates in the midline. Hearing intact. Voice is normal. Shoulder shrug is normal. The tongue has normal motion without fasciculations.   Motor Observation:    No asymmetry, no atrophy, and no involuntary movements noted. Tone:    Normal muscle tone.    Posture:    Posture is normal. normal erect    Strength:    Strength is V/V in the upper and lower limbs.      Sensation: intact to L    Assessment/Plan: 73  year old female seen prior for loss of consciousness, right-sided weakness, migraines, panic attacks, anxiety, fatigue. Now with a new diagosis of possible occipital stroke seen on MRI cervical spine imaging  Addendum 10/02/2018: MRI of the brain does indeed show an occipital stroke. The etiology is unclear but it looks embolic. CTA of the head and neck without etiology. No hx of afib.  She has vascular risk factors such as age, cholesterol, pre-diabetes, sleep apnea, . Her LDL is 77 and recommendations are < 70 so I recommend following up with pcp.  Her HgbA1c is 5.9 and her triglycerides are elevated which is concerning for pre-diabetes, follow up with pcp. She is changing her diet and exercising more.  I would like her to stop ASA and start Plavix 75mg  daily for stroke prevention. I also think she needs a repeat echocardiogram and a 30-day  heart monitor. If this is negative I recommend TEE and loop recorder with Dr. Johney Frame.  Discussed all with patient.   Prior: - Need MRI of the brain and labs today. If +for stroke will need further stroke workup. May need to change ASA to plavix.   Orders Placed This Encounter  Procedures  . MR BRAIN WO CONTRAST  . CT ANGIO HEAD W OR WO CONTRAST  . CT ANGIO NECK W OR WO CONTRAST  . Basic Metabolic Panel  . Hemoglobin A1c  . Lipid panel  . EEG   - has been stable on Keppra, review MRi brain and EEG,  May discuss weaning off of Keppra no seizure in 4 years.   Left occipital neuralgia: stable   Prior eval for LOC: She was admitted for workup, MRI of the brain w/wo contrast unremarkable, EEG abnormal inpatient (bitemporal intermittent slowing) and EEGperformed in clinic today also abnormal (This is an abnormal EEG recording secondary to dysrhythmic theta frequency activity coming from the left temporal region, this study suggests a left brain abnormality with a lowered seizure threshold. No electrographic seizures were recorded, however.) Personally reviewed EEG and agree with findings. Will continue AED.  Seizure: Continue Keppra 500mg  bid. Advised no driving until 6 months - 1 year seizure free based on locality laws, do not bathe or swim alone or do anything that could cause harm should you have a seizure. Follow up in 3 months.   Fatigue - better after being on CPAP  for severe OSA. Continue cpap.. She has occ stress headaches, can treat with OTC meds and stress management.  Continue ASA and statin for stroke prevention. Follow closely with pcp for management of vascular risk factors. Had extensive cardiology wkup with Dr. Graciela HusbandsKlein. May need to change to Plavix.   Panic attacks: Diazepam 2mg  as needed  Naomie DeanAntonia Donnella Morford, MD  [facility] [address] [place], [place] [id number]  Phone [phone] Fax [phone]  A total of 40 minutes was spent in  with this patient. Over half this time was spent on counseling patient on the  1. Cerebellar stroke (HCC)   2. Vertigo   3. Imbalance    diagnosis and different therapeutic options available.

## 2018-09-16 LAB — LIPID PANEL
CHOLESTEROL TOTAL: 156 mg/dL (ref 100–199)
Chol/HDL Ratio: 4.3 ratio (ref 0.0–4.4)
HDL: 36 mg/dL — ABNORMAL LOW (ref >39)
LDL Calculated: 77 mg/dL (ref 0–99)
Triglycerides: 214 mg/dL — ABNORMAL HIGH (ref 0–149)
VLDL Cholesterol Cal: 43 mg/dL — ABNORMAL HIGH (ref 5–40)

## 2018-09-16 LAB — BASIC METABOLIC PANEL
BUN/Creatinine Ratio: 11 — ABNORMAL LOW (ref 12–28)
BUN: 8 mg/dL (ref 8–27)
CALCIUM: 9.5 mg/dL (ref 8.7–10.3)
CHLORIDE: 103 mmol/L (ref 96–106)
CO2: 26 mmol/L (ref 20–29)
Creatinine, Ser: 0.72 mg/dL (ref 0.57–1.00)
GFR calc Af Amer: 97 mL/min/{1.73_m2} (ref >59)
GFR calc non Af Amer: 85 mL/min/{1.73_m2} (ref >59)
GLUCOSE: 97 mg/dL (ref 65–99)
POTASSIUM: 4.7 mmol/L (ref 3.5–5.2)
Sodium: 144 mmol/L (ref 134–144)

## 2018-09-16 LAB — HEMOGLOBIN A1C
ESTIMATED AVERAGE GLUCOSE: 123 mg/dL
HEMOGLOBIN A1C: 5.9 % — AB (ref 4.8–5.6)

## 2018-09-18 ENCOUNTER — Encounter: Payer: Self-pay | Admitting: Neurology

## 2018-09-18 ENCOUNTER — Telehealth: Payer: Self-pay | Admitting: Neurology

## 2018-09-18 NOTE — Telephone Encounter (Signed)
Spoke to the patient she is aware.  °

## 2018-09-18 NOTE — Telephone Encounter (Signed)
[employer] order sent to [facility]. They will obtain the auth and reach out to the pt to schedule.

## 2018-09-19 ENCOUNTER — Other Ambulatory Visit: Payer: Self-pay | Admitting: Family Medicine

## 2018-09-19 DIAGNOSIS — Z1231 Encounter for screening mammogram for malignant neoplasm of breast: Secondary | ICD-10-CM

## 2018-09-26 ENCOUNTER — Ambulatory Visit
Admission: RE | Admit: 2018-09-26 | Discharge: 2018-09-26 | Disposition: A | Discharge status: Home / Self Care | Payer: Medicare HMO | Source: Ambulatory Visit | Attending: Neurology | Admitting: Neurology

## 2018-09-26 DIAGNOSIS — R2689 Other abnormalities of gait and mobility: Secondary | ICD-10-CM

## 2018-09-26 DIAGNOSIS — R42 Dizziness and giddiness: Secondary | ICD-10-CM

## 2018-09-26 DIAGNOSIS — I639 Cerebral infarction, unspecified: Secondary | ICD-10-CM

## 2018-09-26 MED ORDER — IOPAMIDOL (ISOVUE-370) INJECTION 76%
75.0000 mL | Freq: Once | INTRAVENOUS | Status: AC | PRN
  Administered 2018-09-26: 75 mL via INTRAVENOUS

## 2018-10-02 ENCOUNTER — Other Ambulatory Visit: Payer: Self-pay | Admitting: Neurology

## 2018-10-02 ENCOUNTER — Telehealth: Payer: Self-pay | Admitting: Neurology

## 2018-10-02 DIAGNOSIS — I6310 Cerebral infarction due to embolism of unspecified precerebral artery: Secondary | ICD-10-CM

## 2018-10-02 DIAGNOSIS — I639 Cerebral infarction, unspecified: Secondary | ICD-10-CM

## 2018-10-02 MED ORDER — CLOPIDOGREL BISULFATE 75 MG PO TABS: 75.0000 mg | ORAL_TABLET | Freq: Every day | ORAL | 11 refills | Status: DC

## 2018-10-02 NOTE — Telephone Encounter (Signed)
MRI of the brain does indeed show an occipital stroke. The etiology is unclear but it looks embolic. However she has other risk factors such as age, cholesterol, pre-diabetes, sleep apnea, . Her LDL is 77 and recommendations are < 70 so I recommend following up with pcp.  Her HgbA1c is 5.9 and her triglycerides are elevated which is concerning for pre-diabetes, follow up with pcp. She is changing her diet and exercising more.  I would like her to stop ASA and start Plavix 75mg  daily for stroke prevention. I also think she needs a repeat echocardiogram and a 30-day heart monitor. If this is negative I recommend TEE and loop recorder.  Discussed all with patient.

## 2018-10-04 ENCOUNTER — Encounter: Payer: Self-pay | Admitting: *Deleted

## 2018-10-04 ENCOUNTER — Ambulatory Visit (HOSPITAL_COMMUNITY)
Admission: RE | Admit: 2018-10-04 | Discharge: 2018-10-04 | Disposition: A | Discharge status: Home / Self Care | Payer: Medicare HMO | Source: Ambulatory Visit | Attending: Neurology | Admitting: Neurology

## 2018-10-04 DIAGNOSIS — I6310 Cerebral infarction due to embolism of unspecified precerebral artery: Secondary | ICD-10-CM | POA: Diagnosis not present

## 2018-10-04 DIAGNOSIS — I639 Cerebral infarction, unspecified: Secondary | ICD-10-CM | POA: Diagnosis not present

## 2018-10-04 NOTE — Progress Notes (Signed)
Bilateral carotid duplex exam completed. Please see more details of preliminary notes in CV PROC under chart review.  Aloise Copus H Octave Montrose(RDMS RVT) 10/04/18 10:30 AM

## 2018-10-05 ENCOUNTER — Other Ambulatory Visit: Payer: Self-pay | Admitting: Neurology

## 2018-10-05 DIAGNOSIS — I6310 Cerebral infarction due to embolism of unspecified precerebral artery: Secondary | ICD-10-CM

## 2018-10-10 ENCOUNTER — Other Ambulatory Visit (HOSPITAL_COMMUNITY): Payer: Self-pay | Admitting: Emergency Medicine

## 2018-10-10 ENCOUNTER — Other Ambulatory Visit (HOSPITAL_COMMUNITY): Payer: Self-pay | Admitting: Neurology

## 2018-10-10 ENCOUNTER — Ambulatory Visit (HOSPITAL_COMMUNITY)
Admission: RE | Admit: 2018-10-10 | Discharge: 2018-10-10 | Disposition: A | Discharge status: Home / Self Care | Payer: Medicare HMO | Source: Ambulatory Visit | Attending: Neurology | Admitting: Neurology

## 2018-10-10 DIAGNOSIS — I10 Essential (primary) hypertension: Secondary | ICD-10-CM | POA: Diagnosis not present

## 2018-10-10 DIAGNOSIS — I6310 Cerebral infarction due to embolism of unspecified precerebral artery: Secondary | ICD-10-CM | POA: Diagnosis not present

## 2018-10-10 NOTE — Progress Notes (Signed)
*   Echocardiogram 2D Echocardiogram has been performed.  Celene Skeen 10/10/2018, 11:54 AM

## 2018-10-16 ENCOUNTER — Other Ambulatory Visit: Payer: Self-pay | Admitting: Internal Medicine

## 2018-10-16 ENCOUNTER — Ambulatory Visit: Payer: Medicare HMO | Admitting: Internal Medicine

## 2018-10-16 ENCOUNTER — Encounter: Payer: Self-pay | Admitting: Internal Medicine

## 2018-10-16 VITALS — BP 112/82 | HR 62 | Ht 64.5 in | Wt 164.4 lb

## 2018-10-16 DIAGNOSIS — I6310 Cerebral infarction due to embolism of unspecified precerebral artery: Secondary | ICD-10-CM

## 2018-10-16 NOTE — Progress Notes (Signed)
ELECTROPHYSIOLOGY CONSULT NOTE  Patient ID: Lindsey Lopez, MRN: [id number], DOB/AGE: 72-Apr-1947 72 y.o. Admit date: (Not on file) Date of Consult: 10/16/2018  Primary Physician: Ignacia Marvel, DO Primary Cardiologist: new     Lindsey Lopez is a 73 y.o. female who is being seen today for the evaluation of recurrent stroke at the request of Dr Lovena Le.    HPI [name] is a 73 y.o. female referred for consideration of loop recorder  She has recurrent syncope triggered by noxious triggers i.e. blood draws etc. Stereotypical  She saw Dr. Daisy Blossom for possible seizures.  Evaluation included MRI scanning which upon her review demonstrated a small peripheral left cerebellar infarction--MRI 1/20 demonstrated new occipital stroke which appeared embolic.  CT of the head and neck was without etiology.  She was on aspirin at the time; and she has been transitioned to Plavix.  DATE TEST EF   3/16 Myoview  57 % Normal perfusion  1/16 Echo  55-65 %   2/20 Echo  60-65% Neg Bubble study   The patient denies chest pain, shortness of breath, nocturnal dyspnea, orthopnea or peripheral edema.  There have been no palpitations, lightheadedness or syncope.       Past Medical History:  Diagnosis Date  . Anxiety   . CVA (cerebral vascular accident) (HCC) 09/24/2014   TPA reversed the symptoms  . Dysphagia    since choking in August 2016; receiving swallowing evaluations   . High cholesterol   . Seizure [facility])       Surgical History:  Past Surgical History:  Procedure Laterality Date  . BACK SURGERY  07-06-10   "Drill out" surgery on lower back  . ESOPHAGOGASTRODUODENOSCOPY (EGD) WITH PROPOFOL N/A 06/09/2017   Procedure: ESOPHAGOGASTRODUODENOSCOPY (EGD) WITH PROPOFOL;  Surgeon: Toney Reil, MD;  Location: [facility] ENDOSCOPY;  Service: Gastroenterology;  Laterality: N/A;  . Herniated disc  01-2000  . Neck fusion  04-03-10   4 leve      Home Meds: No outpatient  medications have been marked as taking for the 10/16/18 encounter (Appointment) with Duke Salvia, MD.    Allergies:  Allergies  Allergen Reactions  . Codeine Anaphylaxis  . Phenobarbital     Shaky   . Ultram [Tramadol]     Shaky   . Morphine And Related Other (See Comments)    Social History   Socioeconomic History  . Marital status: Married    Spouse name: [name]   . Number of children: 1  . Years of education: College  . Highest education level: Not on file  Occupational History  . Occupation: Retired  Engineer, production  . Financial resource strain: Not on file  . Food insecurity:    Worry: Not on file    Inability: Not on file  . Transportation needs:    Medical: Not on file    Non-medical: Not on file  Tobacco Use  . Smoking status: Never Smoker  . Smokeless tobacco: Never Used  Substance and Sexual Activity  . Alcohol use: No    Alcohol/week: 0.0 standard drinks  . Drug use: No  . Sexual activity: Not on file  Lifestyle  . Physical activity:    Days per week: Not on file    Minutes per session: Not on file  . Stress: Not on file  Relationships  . Social connections:    Talks on phone: Not on file    Gets together: Not on file  Attends religious service: Not on file    Active member of club or organization: Not on file    Attends meetings of clubs or organizations: Not on file    Relationship status: Not on file  . Intimate partner violence:    Fear of current or ex partner: Not on file    Emotionally abused: Not on file    Physically abused: Not on file    Forced sexual activity: Not on file  Other Topics Concern  . Not on file  Social History Narrative   Lives at home with husband, Cleda ClarksRandy Halm.   She is retired.    Has one child   Caffeine use: 1 cup a day     Family History  Problem Relation Age of Onset  . Cancer Mother   . Heart attack Mother   . Cancer Father   . Heart attack Father   . Prostate cancer Brother   . Heart attack  Maternal Grandmother   . Heart attack Maternal Grandfather      ROS:  Please see the history of present illness.     All other systems reviewed and negative.    Physical Exam: Last menstrual period 06/12/1989. General: Well developed, well nourished female in no acute distress. Head: Normocephalic, atraumatic, sclera non-icteric, no xanthomas, nares are without discharge. EENT: normal  Lymph Nodes:  none Neck: Negative for carotid bruits. JVD not elevated. Back:without scoliosis kyphosis Lungs: Clear bilaterally to auscultation without wheezes, rales, or rhonchi. Breathing is unlabored. Heart: RRR with S1 S2. No  murmur . No rubs, or gallops appreciated. Abdomen: Soft, non-tender, non-distended with normoactive bowel sounds. No hepatomegaly. No rebound/guarding. No obvious abdominal masses. Msk:  Strength and tone appear normal for age. Extremities: No clubbing or cyanosis. No edema.  Distal pedal pulses are 2+ and equal bilaterally. Skin: Warm and Dry Neuro: Alert and oriented X 3. CN III-XII intact Grossly normal sensory and motor function . Psych:  Responds to questions appropriately with a normal affect.      Labs: Cardiac Enzymes No results for input(s): CKTOTAL, CKMB, TROPONINI in the last 72 hours. CBC Lab Results  Component Value Date   WBC 11.0 10/21/2017   HGB 13.9 10/21/2017   HCT 41.8 10/21/2017   MCV 83.5 10/21/2017   PLT 217 10/21/2017   PROTIME: No results for input(s): LABPROT, INR in the last 72 hours. Chemistry No results for input(s): NA, K, CL, CO2, BUN, CREATININE, CALCIUM, PROT, BILITOT, ALKPHOS, ALT, AST, GLUCOSE in the last 168 hours.  Invalid input(s): LABALBU Lipids Lab Results  Component Value Date   CHOL 156 09/15/2018   HDL 36 (L) 09/15/2018   LDLCALC 77 09/15/2018   TRIG 214 (H) 09/15/2018   BNP No results found for: PROBNP Thyroid Function Tests: No results for input(s): TSH, T4TOTAL, T3FREE, THYROIDAB in the last 72  hours.  Invalid input(s): FREET3 Miscellaneous No results found for: DDIMER  Radiology/Studies:  Ct Angio Head W Or Wo Contrast  Result Date: 09/26/2018 CLINICAL DATA:  Cerebellar stroke. Vertigo. Imbalance. EXAM: CT ANGIOGRAPHY HEAD AND NECK TECHNIQUE: Multidetector CT imaging of the head and neck was performed using the standard protocol during bolus administration of intravenous contrast. Multiplanar CT image reconstructions and MIPs were obtained to evaluate the vascular anatomy. Carotid stenosis measurements (when applicable) are obtained utilizing NASCET criteria, using the distal internal carotid diameter as the denominator. CONTRAST:  75mL ISOVUE-370 IOPAMIDOL (ISOVUE-370) INJECTION 76% COMPARISON:  Brain MRI 09/26/2018 FINDINGS: CT HEAD FINDINGS Brain:  There is no evidence of acute infarct, intracranial hemorrhage, mass, midline shift, or extra-axial fluid collection. The ventricles are normal in size. There is a small chronic left cerebellar infarct. Chronic cerebral white matter changes are much more conspicuous on MRI. Vascular: As below. Skull: No fracture or focal osseous lesion. Sinuses: Paranasal sinuses and mastoid air cells are clear. Orbits: Unremarkable. Review of the MIP images confirms the above findings CTA NECK FINDINGS Aortic arch: Standard 3 vessel aortic arch with minimal atherosclerotic plaque. Widely patent arch vessel origins. Right carotid system: Patent without evidence of stenosis, dissection, or significant atherosclerosis. Left carotid system: Patent without evidence of stenosis, dissection, or significant atherosclerosis. Vertebral arteries: Patent and codominant without evidence of stenosis, dissection, or significant atherosclerosis. Skeleton: C3-C7 ACDF. Advanced disc degeneration at C7-T1 and T1-2. Other neck: No evidence of acute abnormality or mass. Upper chest: Biapical pleuroparenchymal scarring. Review of the MIP images confirms the above findings CTA HEAD  FINDINGS Anterior circulation: The internal carotid arteries are patent from skull base to carotid termini with minimal nonstenotic atherosclerotic plaque. ACAs and MCAs are patent without evidence of proximal branch occlusion or significant stenosis. The right A1 segment is diminutive or absent. No aneurysm is identified. Posterior circulation: The intracranial vertebral arteries are widely patent to the basilar. Patent bilateral PICA, left AICA, and bilateral SCA origins are identified. The basilar artery is widely patent. There are large right and diminutive left posterior communicating arteries with hypoplasia of the right P1 segment. Both PCAs are patent without evidence of significant stenosis. No aneurysm is identified. Venous sinuses: Patent. Anatomic variants: Hypoplastic right P1. Diminutive or absent right A1. Delayed phase: No abnormal enhancement. Review of the MIP images confirms the above findings IMPRESSION: Largely unremarkable head and neck CTA. No major branch occlusion, significant stenosis, or aneurysm. Chronic left cerebellar infarct. Aortic Atherosclerosis (ICD10-I70.0). Electronically Signed   By: Sebastian Ache M.D.   On: 09/26/2018 13:43   Ct Angio Neck W Or Wo Contrast  Result Date: 09/26/2018 CLINICAL DATA:  Cerebellar stroke. Vertigo. Imbalance. EXAM: CT ANGIOGRAPHY HEAD AND NECK TECHNIQUE: Multidetector CT imaging of the head and neck was performed using the standard protocol during bolus administration of intravenous contrast. Multiplanar CT image reconstructions and MIPs were obtained to evaluate the vascular anatomy. Carotid stenosis measurements (when applicable) are obtained utilizing NASCET criteria, using the distal internal carotid diameter as the denominator. CONTRAST:  75mL ISOVUE-370 IOPAMIDOL (ISOVUE-370) INJECTION 76% COMPARISON:  Brain MRI 09/26/2018 FINDINGS: CT HEAD FINDINGS Brain: There is no evidence of acute infarct, intracranial hemorrhage, mass, midline shift, or  extra-axial fluid collection. The ventricles are normal in size. There is a small chronic left cerebellar infarct. Chronic cerebral white matter changes are much more conspicuous on MRI. Vascular: As below. Skull: No fracture or focal osseous lesion. Sinuses: Paranasal sinuses and mastoid air cells are clear. Orbits: Unremarkable. Review of the MIP images confirms the above findings CTA NECK FINDINGS Aortic arch: Standard 3 vessel aortic arch with minimal atherosclerotic plaque. Widely patent arch vessel origins. Right carotid system: Patent without evidence of stenosis, dissection, or significant atherosclerosis. Left carotid system: Patent without evidence of stenosis, dissection, or significant atherosclerosis. Vertebral arteries: Patent and codominant without evidence of stenosis, dissection, or significant atherosclerosis. Skeleton: C3-C7 ACDF. Advanced disc degeneration at C7-T1 and T1-2. Other neck: No evidence of acute abnormality or mass. Upper chest: Biapical pleuroparenchymal scarring. Review of the MIP images confirms the above findings CTA HEAD FINDINGS Anterior circulation: The internal carotid arteries are patent  from skull base to carotid termini with minimal nonstenotic atherosclerotic plaque. ACAs and MCAs are patent without evidence of proximal branch occlusion or significant stenosis. The right A1 segment is diminutive or absent. No aneurysm is identified. Posterior circulation: The intracranial vertebral arteries are widely patent to the basilar. Patent bilateral PICA, left AICA, and bilateral SCA origins are identified. The basilar artery is widely patent. There are large right and diminutive left posterior communicating arteries with hypoplasia of the right P1 segment. Both PCAs are patent without evidence of significant stenosis. No aneurysm is identified. Venous sinuses: Patent. Anatomic variants: Hypoplastic right P1. Diminutive or absent right A1. Delayed phase: No abnormal enhancement.  Review of the MIP images confirms the above findings IMPRESSION: Largely unremarkable head and neck CTA. No major branch occlusion, significant stenosis, or aneurysm. Chronic left cerebellar infarct. Aortic Atherosclerosis (ICD10-I70.0). Electronically Signed   By: Sebastian Ache M.D.   On: 09/26/2018 13:43   Mr Brain Wo Contrast  Result Date: 09/28/2018 [facility] NEURO[facility] REPORT STUDY DATE: 09/26/18 PATIENT NAME: Gesenia Bonam DOB: 29-Mar-1946 MRN: [id number] ORDERING CLINICIAN: Naomie Dean, MD CLINICAL HISTORY: 73 year old female with left cerebellar stroke (seen on MRI cervical spine from 08/25/18). EXAM: MRI brain (without) TECHNIQUE: MRI of the brain without contrast was obtained utilizing 5 mm axial slices with T1, T2, T2 flair, SWI and diffusion weighted views.  T1 sagittal and T2 coronal views were obtained. CONTRAST: no COMPARISON: 07/11/17 MRI IMAGING SITE: [facility] [address] (1.5 Tesla MRI)  FINDINGS: No abnormal lesions are seen on diffusion-weighted views to suggest acute ischemia. The cortical sulci, fissures and cisterns are normal in size and appearance. Lateral, third and fourth ventricle are normal in size and appearance. No extra-axial fluid collections are seen. No evidence of mass effect or midline shift. Small chronic ischemic infarction in the left cerebellum. Scattered periventricular and subcortical chronic small vessel ischemic disease. On sagittal views the posterior fossa, pituitary gland and corpus callosum are unremarkable. No evidence of intracranial hemorrhage on SWI views. The orbits and their contents, paranasal sinuses and calvarium are unremarkable.  Intracranial flow voids are present.   MRI brain (without) demonstrating: - Small chronic ischemic infarction in the left cerebellum. This is a new finding compared to MRI on 07/11/17. - Scattered periventricular and subcortical chronic small vessel ischemic disease. - No acute  findings. INTERPRETING PHYSICIAN: Suanne Marker, MD Certified in Neurology, Neurophysiology and Neuroimaging [facility] [address] [place], [place] [address] [phone]   Vas US Carotid  Result Date: 10/04/2018 Carotid Arterial Duplex Study Indications:       CVA and Occipital stroke; crytogenis emoblic stroke. Risk Factors:      Hypertension. Comparison Study:  No comparison study available. Performing Technologist: Melodie Bouillon  Examination Guidelines: A complete evaluation includes B-mode imaging, spectral Doppler, color Doppler, and power Doppler as needed of all accessible portions of each vessel. Bilateral testing is considered an integral part of a complete examination. Limited examinations for reoccurring indications may be performed as noted.  Right Carotid Findings: +----------+--------+--------+--------+-----------------------+--------+           PSV cm/sEDV cm/sStenosisDescribe               Comments +----------+--------+--------+--------+-----------------------+--------+ CCA Prox  64      14                                              +----------+--------+--------+--------+-----------------------+--------+  CCA Distal65      17              diffuse and hyperechoic         +----------+--------+--------+--------+-----------------------+--------+ ICA Prox  75      30      1-39%                                   +----------+--------+--------+--------+-----------------------+--------+ ICA Distal106     37                                     tortuous +----------+--------+--------+--------+-----------------------+--------+ ECA       101     18                                              +----------+--------+--------+--------+-----------------------+--------+ +----------+--------+-------+--------+-------------------+           PSV cm/sEDV cmsDescribeArm Pressure (mmHG)  +----------+--------+-------+--------+-------------------+ [id number]                                         +----------+--------+-------+--------+-------------------+ +---------+--------+--+--------+-+---------+ VertebralPSV cm/s36EDV cm/s8Antegrade +---------+--------+--+--------+-+---------+  Left Carotid Findings: +----------+--------+--------+--------+--------+--------+           PSV cm/sEDV cm/sStenosisDescribeComments +----------+--------+--------+--------+--------+--------+ CCA Prox  73      21                               +----------+--------+--------+--------+--------+--------+ CCA Distal69      23                               +----------+--------+--------+--------+--------+--------+ ICA Prox  91      36      1-39%                    +----------+--------+--------+--------+--------+--------+ ICA Distal85      34                               +----------+--------+--------+--------+--------+--------+ ECA       100     14                               +----------+--------+--------+--------+--------+--------+ +----------+--------+--------+--------+-------------------+ SubclavianPSV cm/sEDV cm/sDescribeArm Pressure (mmHG) +----------+--------+--------+--------+-------------------+           98                                          +----------+--------+--------+--------+-------------------+ +---------+-------+--+-------+--+---------------------------------------------+ VertebralPSV    55EDV    16some of the segments with high-resistant               cm/s     cm/s     pattern.                                      +---------+-------+--+-------+--+---------------------------------------------+  Summary: Right Carotid: Velocities in the right ICA are consistent with a 1-39% stenosis. Left Carotid: Velocities in the left ICA are consistent with a 1-39% stenosis. Vertebrals: Bilateral vertebral arteries demonstrate antegrade flow.  *See table(s) above for measurements and observations.  Electronically signed by Delia Heady MD on 10/04/2018 at 12:43:16 PM.    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  She would like to proceed with loop recorder implantation.  Encouraged her to transition to Plavix given the failure of aspirin.  She is on statin therapy.        Sherryl Manges

## 2018-10-16 NOTE — Patient Instructions (Signed)
Medication Instructions:  Your physician recommends that you continue on your current medications as directed. Please refer to the Current Medication list given to you today.  Labwork: None ordered.  Testing/Procedures: Your physician has suggested you have a Linq recorder placed.   Follow-Up: Your physician recommends that you schedule a follow-up appointment in:   10-14 days after March 9 with our device clinic for a wound check.   As needed with Dr Patrice Paradise Implant Instructions.   Please arrive at the [facility] main entrance of [facility] hospital at:   March 9 @ 6AM.  You will enter at the [facility] entrance "A." Follow signs for patient registration.  You may eat and drink normally prior to procedure You may take all your normal medications the morning of the procedure      If you need a refill on your cardiac medications before your next appointment, please call your pharmacy.

## 2018-10-17 ENCOUNTER — Ambulatory Visit
Admission: RE | Admit: 2018-10-17 | Discharge: 2018-10-17 | Disposition: A | Discharge status: Home / Self Care | Payer: Medicare HMO | Source: Ambulatory Visit | Attending: Family Medicine | Admitting: Family Medicine

## 2018-10-17 DIAGNOSIS — Z1231 Encounter for screening mammogram for malignant neoplasm of breast: Secondary | ICD-10-CM

## 2018-10-18 ENCOUNTER — Ambulatory Visit: Payer: Medicare HMO | Admitting: Neurology

## 2018-10-18 DIAGNOSIS — R2689 Other abnormalities of gait and mobility: Secondary | ICD-10-CM

## 2018-10-18 DIAGNOSIS — R42 Dizziness and giddiness: Secondary | ICD-10-CM

## 2018-10-18 DIAGNOSIS — R569 Unspecified convulsions: Secondary | ICD-10-CM | POA: Diagnosis not present

## 2018-10-18 DIAGNOSIS — I639 Cerebral infarction, unspecified: Secondary | ICD-10-CM

## 2018-10-18 NOTE — Procedures (Signed)
      History: Lindsey Lopez is a 73 year old patient with a history of a possible seizure in the past.  She had an event where she felt as if she was falling backwards, she did not fully blackout.  She has been on Keppra for possible seizures.  In the past, EEG evaluations have shown bitemporal slowing.  The patient is being evaluated at this time.  This is a routine EEG.  No skull defects are noted.  Medications include aspirin, calcium supplementation, Celexa, Lescol, gabapentin, Keppra, Robaxin, and Valtrex.  EEG classification: Delta grade 1 left temporal  Description of the recording: The background rhythms of this recording consists of a fairly well modulated medium amplitude alpha rhythm of 10 Hz that is reactive to eye opening and closure.  As the record progresses, photic stimulation is performed, this results in a bilateral and symmetric photic driving response.  Hyperventilation was also done, this results in a minimal buildup of the background rhythm activity with slight symmetric slowing seen.  Intermittently during the recording, episodes of 3 to 4 Hz slowing are seen emanating from the left mid temporal region lasting 1 to 2 seconds.  There are no associated sharp transients or spike or spike-wave discharges.  No electrographic seizures were recorded.  EKG monitor shows no evidence of cardiac rhythm abnormalities with a heart rate of 54.  Impression: This is an abnormal EEG study secondary to episodic slowing emanating from the left temporal region.  The study suggests an abnormality in the left hemisphere.  No electrographic seizures were seen, the study could be consistent with interictal activity, clinical correlation is required.

## 2018-11-13 ENCOUNTER — Ambulatory Visit (HOSPITAL_COMMUNITY)
Admission: RE | Admit: 2018-11-13 | Discharge: 2018-11-13 | Disposition: A | Discharge status: Home / Self Care | Payer: Medicare HMO | Attending: Internal Medicine | Admitting: Internal Medicine

## 2018-11-13 ENCOUNTER — Other Ambulatory Visit: Payer: Self-pay

## 2018-11-13 ENCOUNTER — Encounter (HOSPITAL_COMMUNITY)
Admission: RE | Disposition: A | Discharge status: Home / Self Care | Payer: Self-pay | Source: Home / Self Care | Attending: Internal Medicine

## 2018-11-13 ENCOUNTER — Encounter (HOSPITAL_COMMUNITY): Payer: Self-pay | Admitting: Internal Medicine

## 2018-11-13 DIAGNOSIS — I639 Cerebral infarction, unspecified: Secondary | ICD-10-CM | POA: Diagnosis present

## 2018-11-13 DIAGNOSIS — R569 Unspecified convulsions: Secondary | ICD-10-CM | POA: Diagnosis not present

## 2018-11-13 DIAGNOSIS — I10 Essential (primary) hypertension: Secondary | ICD-10-CM | POA: Diagnosis not present

## 2018-11-13 DIAGNOSIS — R1310 Dysphagia, unspecified: Secondary | ICD-10-CM | POA: Insufficient documentation

## 2018-11-13 DIAGNOSIS — R55 Syncope and collapse: Secondary | ICD-10-CM | POA: Diagnosis not present

## 2018-11-13 DIAGNOSIS — F419 Anxiety disorder, unspecified: Secondary | ICD-10-CM | POA: Insufficient documentation

## 2018-11-13 DIAGNOSIS — Z7902 Long term (current) use of antithrombotics/antiplatelets: Secondary | ICD-10-CM | POA: Diagnosis not present

## 2018-11-13 DIAGNOSIS — I6389 Other cerebral infarction: Secondary | ICD-10-CM | POA: Diagnosis not present

## 2018-11-13 HISTORY — PX: LOOP RECORDER INSERTION: EP1214

## 2018-11-13 SURGERY — LOOP RECORDER INSERTION

## 2018-11-13 MED ORDER — LIDOCAINE-EPINEPHRINE 1 %-1:100000 IJ SOLN
INTRAMUSCULAR | Status: AC
  Filled 2018-11-13: qty 1

## 2018-11-13 MED ORDER — DIAZEPAM 5 MG PO TABS
ORAL_TABLET | ORAL | Status: AC
  Filled 2018-11-13: qty 2

## 2018-11-13 MED ORDER — LIDOCAINE-EPINEPHRINE 1 %-1:100000 IJ SOLN
INTRAMUSCULAR | Status: DC | PRN
  Administered 2018-11-13: 20 mL

## 2018-11-13 MED ORDER — DIAZEPAM 5 MG PO TABS
10.0000 mg | ORAL_TABLET | Freq: Once | ORAL | Status: AC
  Administered 2018-11-13: 10 mg via ORAL

## 2018-11-13 SURGICAL SUPPLY — 2 items
LOOP REVEAL LINQSYS (Prosthesis & Implant Heart) ×1 IMPLANT
PACK LOOP INSERTION (CUSTOM PROCEDURE TRAY) ×2 IMPLANT

## 2018-11-13 NOTE — Progress Notes (Signed)
THANK YOU

## 2018-11-13 NOTE — H&P (Signed)
See office note  Interval history notable for anxiety attack today upon arrival  She describes it , and her husband affirms, that this is a typical recurring issue  She suddenly became quiet, closed her eyes with strong pulse; her hand failed to fall on her head  Upon her awakening we gave her 10 mg po valium as we had discussed in the earlier part of her reaction  Imp Pseudo syncope/ seizures   Eye closure is statistically STRONGLY assoc with pseudo syncope/seizures   Will forward to Dr Lucia Gaskins

## 2018-11-13 NOTE — Discharge Instructions (Signed)
Link recorder instructions given to pt and husband verbally and in writing.  Both verbalize understanding

## 2018-11-14 ENCOUNTER — Other Ambulatory Visit: Payer: Self-pay | Admitting: Neurology

## 2018-11-14 MED ORDER — LORAZEPAM 0.5 MG PO TABS: ORAL_TABLET | ORAL | 4 refills | Status: AC

## 2018-11-16 ENCOUNTER — Ambulatory Visit: Payer: Self-pay | Admitting: Neurology

## 2018-11-27 ENCOUNTER — Ambulatory Visit: Payer: Medicare HMO

## 2018-12-03 ENCOUNTER — Other Ambulatory Visit: Payer: Self-pay | Admitting: Neurology

## 2018-12-04 NOTE — Telephone Encounter (Signed)
Will clarify appt needs with Dr. Lucia Gaskins

## 2018-12-05 ENCOUNTER — Encounter: Payer: Self-pay | Admitting: *Deleted

## 2018-12-05 NOTE — Telephone Encounter (Signed)
Spoke with Dr. Lucia Gaskins. Can hold on appt until around May (current covid19 pandemic). Sent pt [employer]. Will refill keppra.

## 2018-12-06 ENCOUNTER — Telehealth: Payer: Self-pay

## 2018-12-06 NOTE — Telephone Encounter (Signed)
Called and left patient a message asking if she would be willing to switch to a virtual visit on Thursday since she has mychart. I will check back with patient later today or tomorrow.

## 2018-12-07 NOTE — Telephone Encounter (Signed)
I called and spoke with patient, she is willing to do video visit on Thursday. Information for webex and consent sent through mychart. Advised patient if she has any questions before appointment to call or email.

## 2018-12-14 ENCOUNTER — Other Ambulatory Visit: Payer: Self-pay

## 2018-12-14 ENCOUNTER — Telehealth (INDEPENDENT_AMBULATORY_CARE_PROVIDER_SITE_OTHER): Payer: Medicare HMO | Admitting: *Deleted

## 2018-12-14 DIAGNOSIS — I6310 Cerebral infarction due to embolism of unspecified precerebral artery: Secondary | ICD-10-CM

## 2018-12-14 LAB — CUP PACEART REMOTE DEVICE CHECK
Date Time Interrogation Session: 20200409092158
Implantable Pulse Generator Implant Date: 20200309

## 2018-12-14 NOTE — Progress Notes (Signed)
ILR wound check via video visit. Steri-strips removed by patient prior to appointment. Wound without redness or edema. Incision edges approximated, wound well healed. Battery status: good. No symptom, tachy, or AF episodes. Pause/brady detection off at implant. Patient educated about wound care and Carelink monitor. Monthly summary reports and ROV with SK PRN.

## 2018-12-18 ENCOUNTER — Ambulatory Visit (INDEPENDENT_AMBULATORY_CARE_PROVIDER_SITE_OTHER): Payer: Medicare HMO | Admitting: *Deleted

## 2018-12-18 ENCOUNTER — Other Ambulatory Visit: Payer: Self-pay

## 2018-12-18 DIAGNOSIS — I6310 Cerebral infarction due to embolism of unspecified precerebral artery: Secondary | ICD-10-CM

## 2018-12-18 LAB — CUP PACEART REMOTE DEVICE CHECK
Date Time Interrogation Session: 20200411113625
Implantable Pulse Generator Implant Date: 20200309

## 2018-12-25 ENCOUNTER — Encounter: Payer: Self-pay | Admitting: Neurology

## 2018-12-26 ENCOUNTER — Telehealth: Payer: Self-pay | Admitting: Neurology

## 2018-12-26 NOTE — Progress Notes (Signed)
PATIENT: Lindsey Lopez DOB: 13-Nov-1945  REASON FOR VISIT: follow up HISTORY FROM: patient  Virtual Visit via Telephone Note  I connected with Lindsey Lopez on 12/27/18 at  9:00 AM EDT by telephone and verified that I am speaking with the correct person using two identifiers.   I discussed the limitations, risks, security and privacy concerns of performing an evaluation and management service by telephone and the availability of in person appointments. I also discussed with the patient that there may be a patient responsible charge related to this service. The patient expressed understanding and agreed to proceed.   History of Present Illness:  12/27/18 Lindsey Lopez is a 73 y.o. female for follow up of OSA on CPAP.  She is tolerating CPAP therapy well.  She is using her machine every night.  11/26/2018 - 12/25/2018  12/25/2018 Usage days 30/30 days (100%) >= 4 hours 29 days (97%) < 4 hours 1 days (3%) Usage hours 263 hours 39 minutes Average usage (total days) 8 hours 47 minutes Average usage (days used) 8 hours 47 minutes Median usage (days used) 9 hours 8 minutes Total used hours (value since last reset - 12/25/2018) 10,379 hours AirSense 10 AutoSet Serial number [id number] Mode AutoSet Min Pressure 5 cmH2O Max Pressure 15 cmH2O EPR Fulltime EPR level 2 Therapy Pressure - cmH2O Median: 6.9 95th percentile: 9.6 Maximum: 10.8 Leaks - L/min Median: 0.0 95th percentile: 1.3 Maximum: 10.5 Events per hour AI: 0.7 HI: 0.1 AHI: 0.8 Apnea Index Central: 0.1 Obstructive: 0.5 Unknown: 0.0 RERA Index 0.0 Cheyne-Stokes respiration (average duration per night) 0 minutes (0%)  History (copied from Dr Oliva Bustard last note on 04/04/2018)  HPI:  Lindsey Lopez is a 73 y.o. female  Is seen here as a  revisit from Dr Lucia Gaskins,   Interval history from 04/04/2017. Lindsey Lopez has had no further seizure activity or strokes since last seen and 2017. Her sleep study was from  February 2016.  She continues to use CPAP very compliantly and is followed by Christoper Allegra, she has used CPAP for 30 of the last 30 days with 90% compliance 4 hours, average user time is 7 hours and 37 minutes, she is using an AutoSet between 5 and 15 cm water with 2 cm EPR, AHI is 1.2 95th percentile pressure is 10 cm water. No adjustments need to be made, as there are no major air leak, and her pressure needs are well covered within the frame. She had been non compliant last year and I am happy to see her return to using CPAP .  Her Epworth sleepiness score is endorsed at 6 and her fatigue severity at 32 points, the geriatric depression score was endorsed at 2 out of 15 points not indicative of clinical depression.   Consult note : My partner ,Dr. Naomie Dean, ordered a sleep study for this stroke and seizure patient, a meanwhile 73 year old Caucasian female with a history of stroke and seizure in January 2016.  The patient also is status post cervical disc disease neck fusion. She had presented to Dr. Lucia Gaskins and Lindsey Lopez with sleep complaints including sleep related headaches witnessed snoring and apneas and trouble maintaining sleep at night. In daytime she felt excessively sleepy and fatigued.  The patient underwent a sleep study on 10-30-14 and was diagnosed with a very mild apnea index of 6.3/hr.  but a higher RDI or so-called respiratory disturbance index of 18.4.  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 He stopped the she felt that the onset of symptoms was clearly related to her cervical fusion surgery. The patient also had documented 57 minutes of oxygen desaturation was in the 2 hours of sleep she had frequent periodic l/imb movements -but she did not 'kick" herself awake. Arousals related to the periodic limb movements were not seen.  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 And also had several bathroom breaks during her sleep study which could have been related to obstructive sleep apnea as well.   The patient was placed on an AutoSet machine, between 5 and 15 cm water pressure. An EPI level of 2 cm water was used.  The patient is here today was 97% compliance 29 out of 30 days and 27 out of those days she has used her machine for over 4 hours consecutively. This is still a compliance of 90% total the average user time is 7 hours and 8 minutes at night and the 95th percentile for pressure is 10.3 cm water. The residual AHI is 1.6 and the needs to be no adjustments made. She does have some nights higher air leaks and others which may be related to the mask fit. She just a new mask fitted- a nasal pillow for her. She is now using a dream waer mask.  She needs a reorder for this interface , small headset and interface, DREAM WEAR.    She tries to go to bed between 9 and 10 PM and usually will take about 10-15 minutes to fall asleep. She rises between 6 and 6:30 in the morning, usually spontaneous. She would have 3-4 nocturia breaks interrupting her sleep before CPAP was initiated. Now she has 0. As not woken this headaches or awoken from headaches. She had severe stabbing headaches in the middle of the night before CPAP was initiated these clusters can be provoked by CO2 retention and hypoxemia at night causing him return high blood pressures. Epworth is now 5 and FSS 24 , she feels better, her husband gets better sleep as she stopped snoring.   Interval history ; sixth of June 2017. I had the pleasure of seeing Lindsey Lopez here again in from now on for a yearly revisit. She has been a CPAP user but has not managed to use the machine 4 hours on average at night current user time is 3 hours and 33 minutes per night, with a 40%  compliance for 12 days. The patient uses an AutoSet between 5 and 15 cm water and 2 cm EPR. Residual AHI is 1.1 she does have significant air leaks intermittently but her pressure supplied is 10.3 cm for 95th percent of the time. The machine does work for her. She endorsed the Epworth sleepiness score at 1 point only. My concern is that she needs to use the machine longer per day. She is using a below the nose interface, needs a small headset. She had some trouble finding the right size and that may have contributed to nights where she didn't feel comfortable using it over up with choking for air. She orders her supply from the Internet, [employer].    The patient has seen Lindsey Guile, FNP. [place], [place] Loss of consciousness , Dr Lucia Gaskins 's visit : Interval Update 02/06/2015:  She is doing well. Tolerating Keppra well. No further episodes of seizure-like activity. On the  And feels better. Headaches are better. She is having vertigo issues, spinning sensation on head turning. Dix hallpike is negative however sounds like BPPV. Has decreased hearing as well so will refer for ENT consult.   HPI: Lindsey Lopez is a 73 y.o. female here as a referral from NP Lindsey GuileLynn Lopez for loss of consciousness.  [place] [employer] She was standing in line for a show and she felt like she was falling backwards. She was out for 10-15 minutes, totally blacked out. Husband provides most of information. Eyes closed. Everything turned black. No abnormal movements, loss of bowel or blader. She just laid there. There was no response. She was breathing, she has no recollection of events. Gradually she began to open her eyes but couldn't respond. A nurse in the crowd helped, and the more the nurse worked with her, the more responsive she became. Right arm and right leg weren't moving as well as the left side. The right side of the face drooped. After 15 minutes she responded better, they sat her up,  This is the first time, never happened before. No inciting factors. She slowly got better, was very tired afterwards, slept for 2 days. She sometimes has some word-finding difficulties otherwise back to normal. She is on aspirin now (was not before). She is on a statin. No diabetes. There were no new medications or other inciting events. EEg slowed bitemporal slowing. No personal or FHx of seizures. Never had a stroke.  Headaches are every day. Bitemporal pressure. She is very tense. Headache wake her up in the middle of the night. When she rolls on her back she starts breathing through her mouth, significant snoring to the point she wakes herself. She is excessively tired during the day (however Epworth scale only 2). She takes naps often. She has gained 25-30 pounds recently. She has witnessed apneic events by husband. Wakes up with headaches. Daily headaches.  Reviewed notes, labs and imaging from outside physicians, which showed: MRI of the brain w/wo contrast unremarkable with some non-specific white matter periventricular increased signal, no infarct or mass or bleed, normal architecture, MRi of the cervical spine with postoperative changes with anterior cervical diskectomy and instrumentation c3-c7, solid fusion c3-c5, stenosis at c7-t1 and t1-t2 without cord flattening, no fracture or cord lesion. MRA neck w/wo: no significant stenosis interna carotid arteries or vertebral arteries. MRA head, normal. TEE with normal EF, abnormal left vent diastolic dysfunction, mild MR, trace TR, left vent nml size. Carotid duplex without hemodynamically significant stenosis in the bilat common, internal and external carotid arteries. EEG abnormal, intermittent bitemporal slpwing without interictal or epileptiform abnormalities.     Observations/Objective:  Generalized: Well developed, in no acute distress  Mentation: Alert oriented to time, place, history taking. Follows all commands speech and language  fluent   Assessment and Plan:  73 y.o. year old female  has a past medical history of Anxiety, CVA (cerebral vascular accident) (HCC) (09/24/2014), Dysphagia, High cholesterol, and Seizure (HCC). with    ICD-10-CM   1. OSA on CPAP G47.33 For home use only DME continuous positive airway pressure (CPAP)   Z99.89    Her compliance download shows excellent compliance with nightly use.  She was encouraged to continue using CPAP therapy nightly and for greater than 4 hours each night.  I recommend a one-year follow-up for CPAP download.  Orders Placed This Encounter  Procedures   For home use only DME continuous positive airway pressure (CPAP)    Supplies with [email or web address] please    Order Specific Question:   Patient has OSA or probable OSA    Answer:   Yes    Order Specific Question:   Is the patient currently using CPAP in the home    Answer:   Yes    Order Specific Question:   Settings    Answer:   Other see comments    Order Specific Question:   CPAP supplies needed    Answer:   Mask, headgear, cushions, filters, heated tubing and water chamber    No orders of the defined types were placed in this encounter.    Follow Up Instructions:  I discussed the assessment and treatment plan with the patient. The patient was provided an opportunity to ask questions and all were answered. The patient agreed with the plan and demonstrated an understanding of the instructions.   The patient was advised to call back or seek an in-person evaluation if the symptoms worsen or if the condition fails to improve as anticipated.  I provided 20 minutes of non-face-to-face time during this encounter.  Patient is located at her place of residence during video conference.  Provider is located at her place of residence.  Alverda Skeans, RN helped to facilitate visit.   Shawnie Dapper, NP

## 2018-12-26 NOTE — Telephone Encounter (Signed)
Called the patient cause she is needed to be seen for CPAP supplies orders. Advised the patient we are offering video visits. Our office is now providing the capability to offer the patients virtual visits at this time. Informed of what that process looks like and informed that the Virtual visit will still be billed through insurance as such. Due to [place],informed the patient since the appointment is taking place over the phone/internet app, we can't guarantee the security of the phone line. With that said if we do move forward I would have to get verbal consent to completed the call over the phone. Patient gave verbal consent to move forward with the video visit. I have reviewed the patient's chart and made sure that everything is up to date. Patient is also made aware that since this is a video visit we are able to complete the visit but a physical exam is not able to be done since the patient is not present in person. Pt's e-mail is [email or web address] Pt understands that the [employer] webex software must be downloaded and operational on the device pt plans to use for the visit. Pt verbalized understanding of this information and will states to be ready for the visit at least 15 min prior to the visit.  Advised the patient to contact me prior to the apt if we need to complete a test run prior. Pt verbalized understanding.  DME- patient is not established with local DME, orders are through [email or web address]

## 2018-12-26 NOTE — Progress Notes (Signed)
Carelink Summary Report / Loop Recorder 

## 2018-12-26 NOTE — Telephone Encounter (Signed)
appt placed in Lindsey Lopez's calendar for VV webex, email sent.

## 2018-12-27 ENCOUNTER — Encounter: Payer: Self-pay | Admitting: Family Medicine

## 2018-12-27 ENCOUNTER — Telehealth: Payer: Self-pay

## 2018-12-27 ENCOUNTER — Other Ambulatory Visit: Payer: Self-pay

## 2018-12-27 ENCOUNTER — Ambulatory Visit (INDEPENDENT_AMBULATORY_CARE_PROVIDER_SITE_OTHER): Payer: Medicare HMO | Admitting: Family Medicine

## 2018-12-27 DIAGNOSIS — Z9989 Dependence on other enabling machines and devices: Secondary | ICD-10-CM | POA: Diagnosis not present

## 2018-12-27 DIAGNOSIS — G4733 Obstructive sleep apnea (adult) (pediatric): Secondary | ICD-10-CM

## 2018-12-27 NOTE — Telephone Encounter (Signed)
CPAP orders have been faxed to [email or web address] at [phone]. Confirmation fax has been received.

## 2018-12-27 NOTE — Telephone Encounter (Signed)
I called pt and we changed her email to gmail account.  This seemed to work.  She will keep as a virtual visit as originally set up.

## 2019-01-18 ENCOUNTER — Ambulatory Visit (INDEPENDENT_AMBULATORY_CARE_PROVIDER_SITE_OTHER): Payer: Medicare HMO | Admitting: *Deleted

## 2019-01-18 ENCOUNTER — Other Ambulatory Visit: Payer: Self-pay

## 2019-01-18 DIAGNOSIS — I6310 Cerebral infarction due to embolism of unspecified precerebral artery: Secondary | ICD-10-CM | POA: Diagnosis not present

## 2019-01-18 LAB — CUP PACEART REMOTE DEVICE CHECK
Date Time Interrogation Session: 20200514134400
Implantable Pulse Generator Implant Date: 20200309

## 2019-01-23 ENCOUNTER — Encounter: Payer: Self-pay | Admitting: *Deleted

## 2019-01-23 NOTE — Progress Notes (Signed)
Carelink Summary Report / Loop Recorder 

## 2019-01-24 ENCOUNTER — Telehealth: Payer: Self-pay

## 2019-01-24 NOTE — Telephone Encounter (Signed)
Follow up has been scheduled. Patient is aware of appt day and time.   

## 2019-01-25 ENCOUNTER — Other Ambulatory Visit: Payer: Self-pay

## 2019-01-25 ENCOUNTER — Encounter: Payer: Self-pay | Admitting: *Deleted

## 2019-01-25 ENCOUNTER — Ambulatory Visit (INDEPENDENT_AMBULATORY_CARE_PROVIDER_SITE_OTHER): Payer: Medicare HMO | Admitting: Neurology

## 2019-01-25 DIAGNOSIS — F410 Panic disorder [episodic paroxysmal anxiety] without agoraphobia: Secondary | ICD-10-CM | POA: Diagnosis not present

## 2019-01-25 DIAGNOSIS — M5481 Occipital neuralgia: Secondary | ICD-10-CM

## 2019-01-25 DIAGNOSIS — G4733 Obstructive sleep apnea (adult) (pediatric): Secondary | ICD-10-CM | POA: Diagnosis not present

## 2019-01-25 DIAGNOSIS — R55 Syncope and collapse: Secondary | ICD-10-CM

## 2019-01-25 DIAGNOSIS — I639 Cerebral infarction, unspecified: Secondary | ICD-10-CM | POA: Diagnosis not present

## 2019-01-25 NOTE — Progress Notes (Signed)
[facility]    Provider:  Dr Lucia GaskinsAhern Referring Provider: Ignacia MarvelAngrish, Krishan Sumit,* Primary Care Physician:  Tawanna SoloAngrish, Krishan Sumit, DO  CC: Occipital stroke, occipital neuralgia, OSA, vasovagal syncope and panic attacks  Interval history 01/25/2019:   Virtual Visit via Video Note  I connected with Lindsey Lopez on 01/26/19 at  2:00 PM EDT by a video enabled telemedicine application and verified that I am speaking with the correct person using two identifiers.  Location: Patient: home Provider: office   I discussed the limitations of evaluation and management by telemedicine and the availability of in person appointments. The patient expressed understanding and agreed to proceed.  Follow Up Instructions:    I discussed the assessment and treatment plan with the patient. The patient was provided an opportunity to ask questions and all were answered. The patient agreed with the plan and demonstrated an understanding of the instructions.   The patient was advised to call back or seek an in-person evaluation if the symptoms worsen or if the condition fails to improve as anticipated.  I provided 30 minutes of non-face-to-face time during this encounter.   Lindsey Lopez, Lindsey Weger B, MD      Patient is here for follow-up of multiple issues.  Recent imaging showed an occipital stroke, unknown etiology, chronic, possibly embolic.  Patient had a complete stroke evaluation, loop recorder placed.  Lindsey Lopez feels very well, denies any other issues at this time.  We discussed patient's seizure medications, initially patient was placed on Keppra for an episode of alteration of awareness and abnormal movements.  Lindsey Lopez also has a history of vasovagal syncope and panic attacks which may have been the etiology.  Lindsey Lopez was placed on Keppra because EEG showed epileptiform activity.  At this point I discussed we could keep her on it, slowly wean her off of it, it is unclear I cannot promise that  Lindsey Lopez will have another seizure.  At this point we decided to continue with the Keppra.  Interval history 09/15/2018: This is a 73 year old patient who is here for a new issue.  Lindsey Lopez had cervical spine imaging completed at [facility] who told her there may be "brain restriction to the cerebellum".  I reviewed the report, I do not have the imaging to take a look at, the report stated that there is a small focal peripheral left cerebellar hemisphere infarction recommend comparison with prior brain imaging.  I reviewed report of MRI of the brain in 2016 which did not show this.  Reviewed images from 2018 which did not show the lesion. Patient is here today to discuss. Lindsey Lopez is still on the Keppra for a possible seizure.  We may consider weaning her off of the Keppra, has not had an episode. Lindsey Lopez has been having vertigo, imbalance.     Interval history : Lindsey Lopez is still on Keppra, no seizures, headaches resolved but now new headache, occipital on the left, started this past weekend, it woke her up in the middle of the night, there all the time. Continuous. Lasted 3 days today may be a little better. Felt like "it was there", not throbbing, more of an ache. Irritating. Feels like it is gone but it comes back. No other symptoms. No other focal neurologic deficits, associated symptoms, inciting events or modifiable factors. Not tender to touch. No vision changes. Positional, wakes her at night. New onset 3 days ago.   Interval history 08/10/2016: Lindsey Lopez has headaches, bilateral temples and back of the head, pounding and throbbing, some nausea,  Lindsey Lopez doesn't take anything for the headache. Lindsey Lopez has headaches 2x a week, they are caused by stress. Headaches started in may when mother moved in Lindsey Lopez is using her cpap regularly now, they changed the mask and has the pillow and Lindsey Lopez wears it every night. No side effects from the Keppra. Discussed stress management, can take ocassional OTC medication for the headaches. Do not sound migrainous. No  light or sound sensitivity.   Interval update 08/11/2015: Lindsey Lopez has some "episodes" since being seen. Lindsey Lopez was overwhelmed by her son's wedding and by the end of the night Lindsey Lopez couldn't think, Lindsey Lopez had to leave, going home Lindsey Lopez kept asking questions about the wedding and Lindsey Lopez couldn't remember, Lindsey Lopez gets disoriented. Lindsey Lopez did eventually remember everything but Lindsey Lopez needs to be careful not to overdue it. Lindsey Lopez remembers telling her grandson Lindsey Lopez felt lost, no altered mentation. Lindsey Lopez naps daily. This is new, started in June. Lindsey Lopez has been fine on the cpap. Lindsey Lopez is due in June for follow up with Dr. Vickey Huger. Lindsey Lopez has had a few panic attacks. Not very often. They went to [facility] and Lindsey Lopez had a pamnic attack, they had to lay her down on the floor and stop the ride. Lindsey Lopez has had two this year. The panic attack can last   Interval Update 02/06/2015:   Lindsey Lopez is doing well. Tolerating Keppra well. No further episodes of seizure-like activity. On the And feels better. Headaches are better. Lindsey Lopez is having vertigo issues, spinning sensation on head turning. Dix hallpike is negative however sounds like BPPV. Has decreased hearing as well so will refer for ENT consult.   HPI: Lindsey Lopez is a 73 y.o. female here as a referral from Dr. Shayne Alken for loss of consciousness. They drove to [place], on the 19th went to [place] [employer]. Lindsey Lopez was standing in line for a show and Lindsey Lopez felt like Lindsey Lopez was falling backwards. Lindsey Lopez was out for 10-15 minutes, totally blacked out. Husband provides most of information. Eyes closed. Everything turned black. No abnormal movements, loss of bowel or blader. Lindsey Lopez just laid there. There was no response. Lindsey Lopez was breathing, Lindsey Lopez has no recollection of events. Gradually Lindsey Lopez began to open her eyes but couldn't respond. A nurse in the crowd helped, and the more the nurse worked with her, the more responsive Lindsey Lopez became. Right arm and right leg weren't moving as well as the left side. The right side of the face drooped. After  15 minutes Lindsey Lopez responded better, they sat her up, This is the first time, never happened before. No inciting factors. Lindsey Lopez slowly got better, was very tired afterwards, slept for 2 days. Lindsey Lopez sometimes has some word-finding difficulties otherwise back to normal. Lindsey Lopez is on aspirin now (was not before). Lindsey Lopez is on a statin. No diabetes. There were no new medications or other inciting events. EEg slowed bitemporal slowing. No personal or FHx of seizures. Never had a stroke.   Headaches are every day. Bitemporal pressure. Lindsey Lopez is very tense. Headache wake her up in the middle of the night. When Lindsey Lopez rolls on her back Lindsey Lopez starts breathing through her mouth, significant snoring to the point Lindsey Lopez wakes herself. Lindsey Lopez is excessively tired during the day (however Epworth scale only 2). Lindsey Lopez takes naps often. Lindsey Lopez has gained 25-30 pounds recently. Lindsey Lopez has witnessed apneic events by husband. Wakes up with headaches. Daily headaches.   Reviewed notes from [facility], labs and imaging from outside physicians, which showed: MRI of the brain w/wo contrast unremarkable with  some non-specific white matter periventricular increased signal, no infarct or mass or bleed, normal architecture, MRi of the cervical spine with postoperative changes with anterior cervical diskectomy and instrumentation c3-c7, solid fusion c3-c5, stenosis at c7-t1 and t1-t2 without cord flattening, no fracture or cord lesion. MRA neck w/wo: no significant stenosis interna carotid arteries or vertebral arteries. MRA head, normal. TEE with normal EF, abnormal left vent diastolic dysfunction, mild MR, trace TR, left vent nml size. Carotid duplex without hemodynamically significant stenosis in the bilat common, internal and external carotid arteries. EEG abnormal, intermittent bitemporal slpwing without interictal or epileptiform abnormalities.   Review of Systems: Patient complains of symptoms per HPI as well as the following symptoms: Memory loss, dizziness,  depression, anxiety, joint pain, back pain, aching muscles, neck stiffness. Pertinent negatives per HPI. All others negative.  Social History   Socioeconomic History   Marital status: Married    Spouse name: [name]    Number of children: 1   Years of education: [employer] education level: Not on file  Occupational History   Occupation: Retired  Ecologist strain: Not on Pensions consultant insecurity:    Worry: Not on file    Inability: Not on Occupational hygienist needs:    Medical: Not on file    Non-medical: Not on file  Tobacco Use   Smoking status: Never Smoker   Smokeless tobacco: Never Used  Substance and Sexual Activity   Alcohol use: No    Alcohol/week: 0.0 standard drinks   Drug use: No   Sexual activity: Not on file  Lifestyle   Physical activity:    Days per week: Not on file    Minutes per session: Not on file   Stress: Not on file  Relationships   Social connections:    Talks on phone: Not on file    Gets together: Not on file    Attends religious service: Not on file    Active member of club or organization: Not on file    Attends meetings of clubs or organizations: Not on file    Relationship status: Not on file   Intimate partner violence:    Fear of current or ex partner: Not on file    Emotionally abused: Not on file    Physically abused: Not on file    Forced sexual activity: Not on file  Other Topics Concern   Not on file  Social History Narrative   Lives at home with husband, Shakeera Rightmyer.   Lindsey Lopez is retired.    Has one child   Caffeine use: 1 cup a day    Family History  Problem Relation Age of Onset   Cancer Mother    Heart attack Mother    Cancer Father    Heart attack Father    Prostate cancer Brother    Heart attack Maternal Grandmother    Heart attack Maternal Grandfather     Past Medical History:  Diagnosis Date   Anxiety    CVA (cerebral vascular accident) (HCC) 09/24/2014     TPA reversed the symptoms   Dysphagia    since choking in August 2016; receiving swallowing evaluations    High cholesterol    Seizure (HCC)     Past Surgical History:  Procedure Laterality Date   BACK SURGERY  07-06-10   "Drill out" surgery on lower back   ESOPHAGOGASTRODUODENOSCOPY (EGD) WITH PROPOFOL N/A 06/09/2017   Procedure: ESOPHAGOGASTRODUODENOSCOPY (EGD) WITH  PROPOFOL;  Surgeon: Toney Reil, MD;  Location: [facility] ENDOSCOPY;  Service: Gastroenterology;  Laterality: N/A;   Herniated disc  01-2000   LOOP RECORDER INSERTION N/A 11/13/2018   Procedure: LOOP RECORDER INSERTION;  Surgeon: Duke Salvia, MD;  Location: [facility] INVASIVE CV LAB;  Service: Cardiovascular;  Laterality: N/A;   Neck fusion  04-03-10   4 leve     Current Outpatient Medications  Medication Sig Dispense Refill   amitriptyline (ELAVIL) 25 MG tablet Take 25 mg by mouth at bedtime.     citalopram (CELEXA) 20 MG tablet Take 10 mg by mouth 2 (two) times daily.      clopidogrel (PLAVIX) 75 MG tablet Take 1 tablet (75 mg total) by mouth daily. 30 tablet 11   fluvastatin XL (LESCOL XL) 80 MG 24 hr tablet Take 80 mg by mouth at bedtime.      levETIRAcetam (KEPPRA) 500 MG tablet TAKE 1 TABLET TWICE A DAY 180 tablet 1   LORazepam (ATIVAN) 0.5 MG tablet Take 1/2 tablet(0.25mg ) or a whole tablet(0.5mg ) for panic attack as needed up to twice daily. 10 tablet 4   Multiple Vitamins-Minerals (CENTRUM SILVER 50+WOMEN PO) Take 1 tablet by mouth daily.     valACYclovir (VALTREX) 500 MG tablet Take 500 mg by mouth at bedtime.      No current facility-administered medications for this visit.     Allergies as of 01/25/2019 - Review Complete 01/25/2019  Allergen Reaction Noted   Codeine Anaphylaxis 10/08/2014   Phenobarbital  10/08/2014   Ultram [tramadol]  10/08/2014   Morphine and related Other (See Comments) 06/09/2017    Vitals: LMP 06/12/1989 (Exact Date)  Last Weight:  Wt Readings from Last 1  Encounters:  11/13/18 161 lb 1 oz (73.1 kg)   Last Height:   Ht Readings from Last 1 Encounters:  11/13/18 5' 4.5" (1.638 m)   Physical exam: Exam: Gen: NAD, conversant, well nourised, well groomed                     Eyes: Conjunctivae clear without exudates or hemorrhage  Neuro: Detailed Neurologic Exam  Speech:    Speech is normal; fluent and spontaneous with normal comprehension.  Cognition:    The patient is oriented to person, place, and time;     Cranial Nerves:    The pupils are equal, round, and reactive to light. Visual fields are full to finger confrontation. Extraocular movements are intact. Trigeminal sensation is intact and the muscles of mastication are normal. The face is symmetric. The palate elevates in the midline. Hearing intact. Voice is normal. Shoulder shrug is normal. The tongue has normal motion without fasciculations.   Motor Observation:    No asymmetry, no atrophy, and no involuntary movements noted. Tone:    Normal muscle tone.    Posture:    Posture is normal. normal erect    Strength:    Strength is V/V in the upper and lower limbs.      Sensation: intact to L    Assessment/Plan: 73  year old female seen prior for loss of consciousness, right-sided weakness, migraines, panic attacks, anxiety, fatigue. Now with a new diagosis of possible occipital stroke seen on MRI cervical spine imaging  I had a long d/w patient about her prior stroke, risk for recurrent stroke/TIAs, personally independently reviewed imaging studies and stroke evaluation results and answered questions.Continue Plavix for secondary stroke prevention and maintain strict control of hypertension with blood pressure goal below 130/90,  diabetes with hemoglobin A1c goal below 6.5% and lipids with LDL cholesterol goal below 70 mg/dL. I also advised the patient to eat a healthy diet with plenty of whole grains, cereals, fruits and vegetables, exercise regularly and maintain ideal body  weight .Followup in the future with me in6 months or call earlier if necessary.  Left occipital neuralgia stable  Patient has vasovagal syncope as well as panic attacks.  This may have been the etiology for her alteration of awareness and abnormal movements several years prior when we started her on Keppra.  However EEG does show epileptiform activity focally so we have kept her on the Keppra.  Discussed weaning off, patient prefers to continue at this time. 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 No electrographic seizures were recorded, however)  Obstructive sleep apnea: Doing well, on CPAP, very compliant.  Panic attacks: Diazepam  as needed  PRIOR  Addendum 10/02/2018: MRI of the brain does indeed show an occipital stroke. The etiology is unclear but it looks embolic. CTA of the head and neck without etiology. No hx of afib.  Lindsey Lopez has vascular risk factors such as age, cholesterol, pre-diabetes, sleep apnea, . Her LDL is 77 and recommendations are < 70 so I recommend following up with pcp.  Her HgbA1c is 5.9 and her triglycerides are elevated which is concerning for pre-diabetes, follow up with pcp. Lindsey Lopez is changing her diet and exercising more.  I would like her to stop ASA and start Plavix  daily for stroke prevention. I also think Lindsey Lopez needs a repeat echocardiogram and a 30-day heart monitor. If this is negative I recommend TEE and loop recorder with Dr. Johney Frame.  Discussed all with patient.     Naomie Dean, MD  [facility] [address] [place], [place] [address]  Phone [phone] Fax [phone]

## 2019-01-26 DIAGNOSIS — I639 Cerebral infarction, unspecified: Secondary | ICD-10-CM | POA: Insufficient documentation

## 2019-01-26 DIAGNOSIS — F410 Panic disorder [episodic paroxysmal anxiety] without agoraphobia: Secondary | ICD-10-CM | POA: Insufficient documentation

## 2019-01-26 DIAGNOSIS — M5481 Occipital neuralgia: Secondary | ICD-10-CM | POA: Insufficient documentation

## 2019-01-26 DIAGNOSIS — R55 Syncope and collapse: Secondary | ICD-10-CM | POA: Insufficient documentation

## 2019-02-20 ENCOUNTER — Ambulatory Visit (INDEPENDENT_AMBULATORY_CARE_PROVIDER_SITE_OTHER): Payer: Medicare HMO | Admitting: *Deleted

## 2019-02-20 DIAGNOSIS — I6310 Cerebral infarction due to embolism of unspecified precerebral artery: Secondary | ICD-10-CM | POA: Diagnosis not present

## 2019-02-20 LAB — CUP PACEART REMOTE DEVICE CHECK
Date Time Interrogation Session: 20200616135736
Implantable Pulse Generator Implant Date: 20200309

## 2019-03-02 NOTE — Progress Notes (Signed)
Carelink Summary Report / Loop Recorder 

## 2019-03-26 ENCOUNTER — Ambulatory Visit (INDEPENDENT_AMBULATORY_CARE_PROVIDER_SITE_OTHER): Payer: Medicare HMO | Admitting: *Deleted

## 2019-03-26 DIAGNOSIS — I6310 Cerebral infarction due to embolism of unspecified precerebral artery: Secondary | ICD-10-CM | POA: Diagnosis not present

## 2019-03-26 LAB — CUP PACEART REMOTE DEVICE CHECK
Date Time Interrogation Session: 20200719141206
Implantable Pulse Generator Implant Date: 20200309

## 2019-04-09 NOTE — Telephone Encounter (Signed)
Handicap placard request form completed, signed and mailed to pt at address requested.

## 2019-04-09 NOTE — Progress Notes (Signed)
Carelink Summary Report / Loop Recorder 

## 2019-04-27 ENCOUNTER — Ambulatory Visit (INDEPENDENT_AMBULATORY_CARE_PROVIDER_SITE_OTHER): Payer: Medicare HMO | Admitting: *Deleted

## 2019-04-27 DIAGNOSIS — I6310 Cerebral infarction due to embolism of unspecified precerebral artery: Secondary | ICD-10-CM | POA: Diagnosis not present

## 2019-04-29 LAB — CUP PACEART REMOTE DEVICE CHECK
Date Time Interrogation Session: 20200821140948
Implantable Pulse Generator Implant Date: 20200309

## 2019-05-04 NOTE — Progress Notes (Signed)
Carelink Summary Report / Loop Recorder 

## 2019-05-07 IMAGING — MG DIGITAL SCREENING BILATERAL MAMMOGRAM WITH TOMO AND CAD
8 series · 8 of 24 positions shown · non-contrast
Comparison: Previous exam(s).

CLINICAL DATA: Screening.

EXAM:
DIGITAL SCREENING BILATERAL MAMMOGRAM WITH TOMO AND CAD

[L MLO synth-2D]
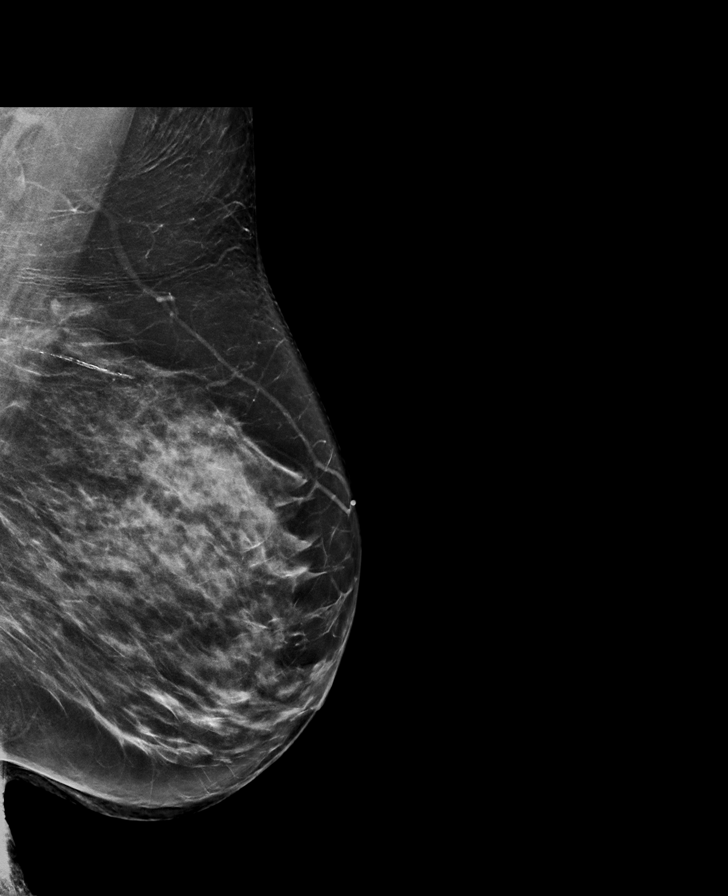

[R CC synth-2D]
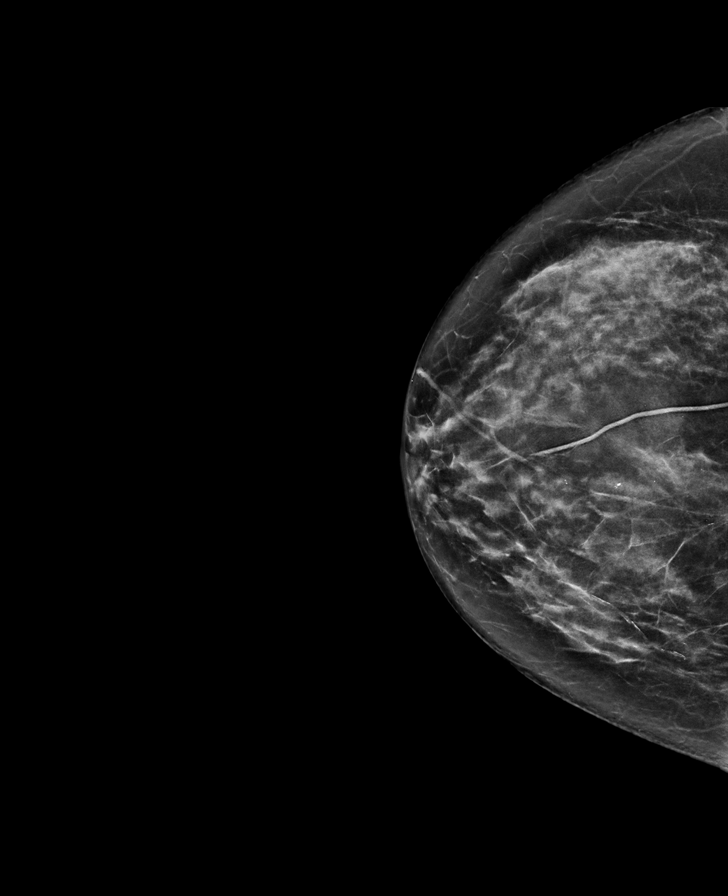

[L CC synth-2D]
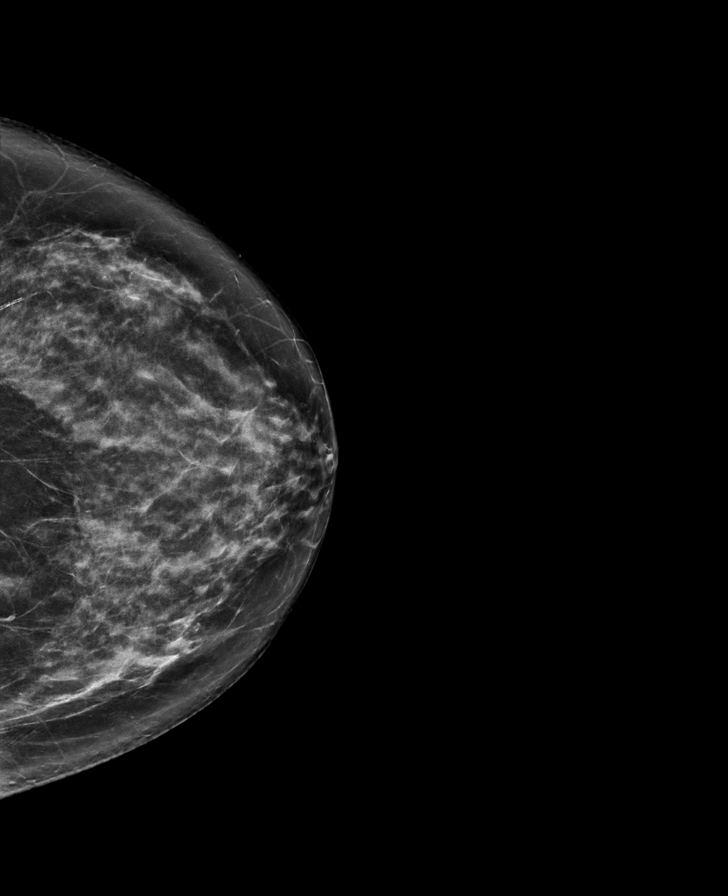

[R MLO synth-2D]
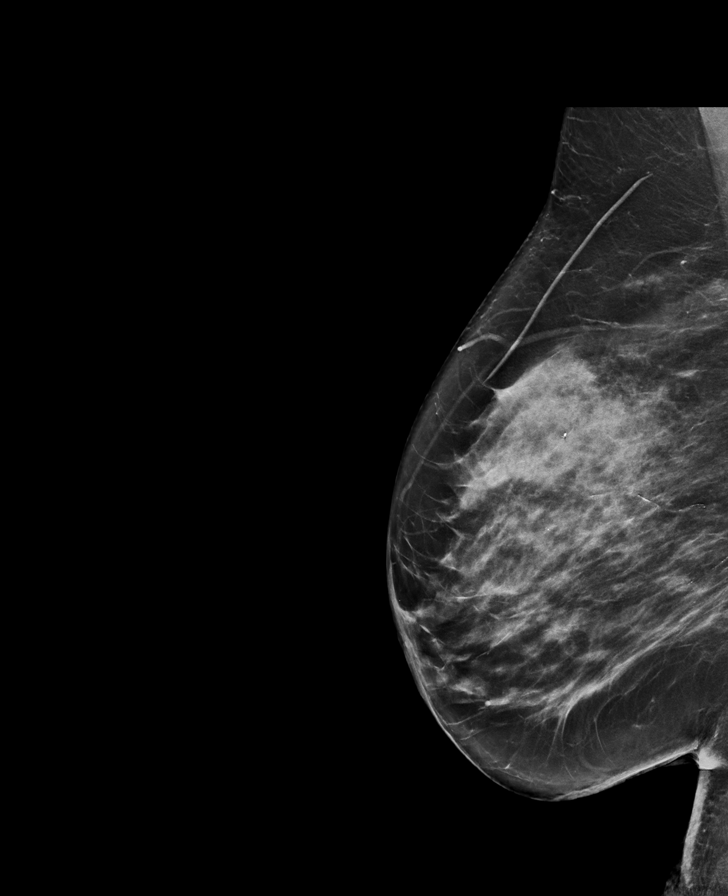

[R CC tomo · tomo slice 33/65.0]
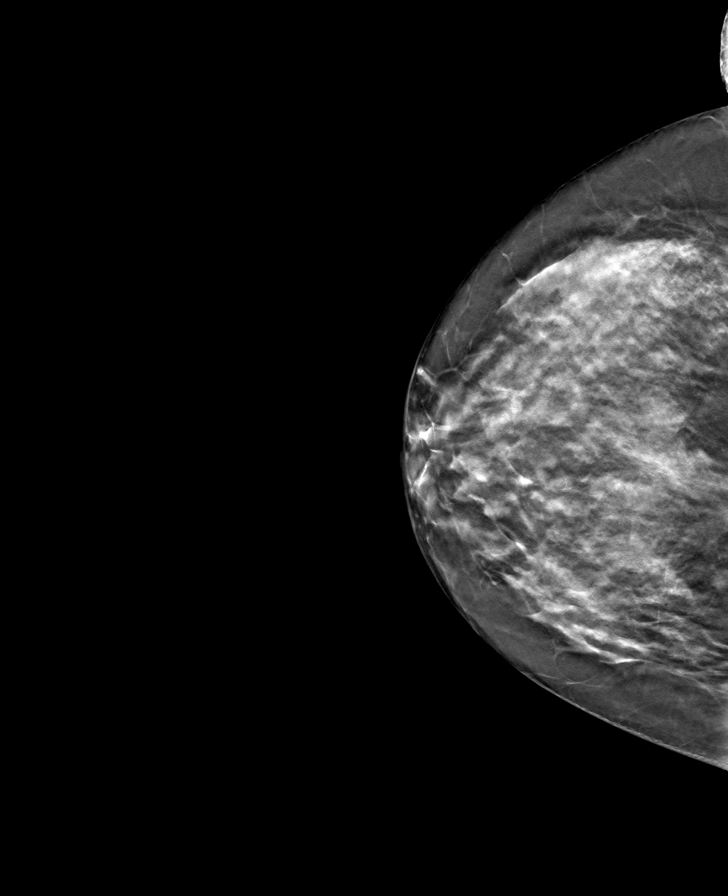

[R MLO tomo · tomo slice 39/76.0]
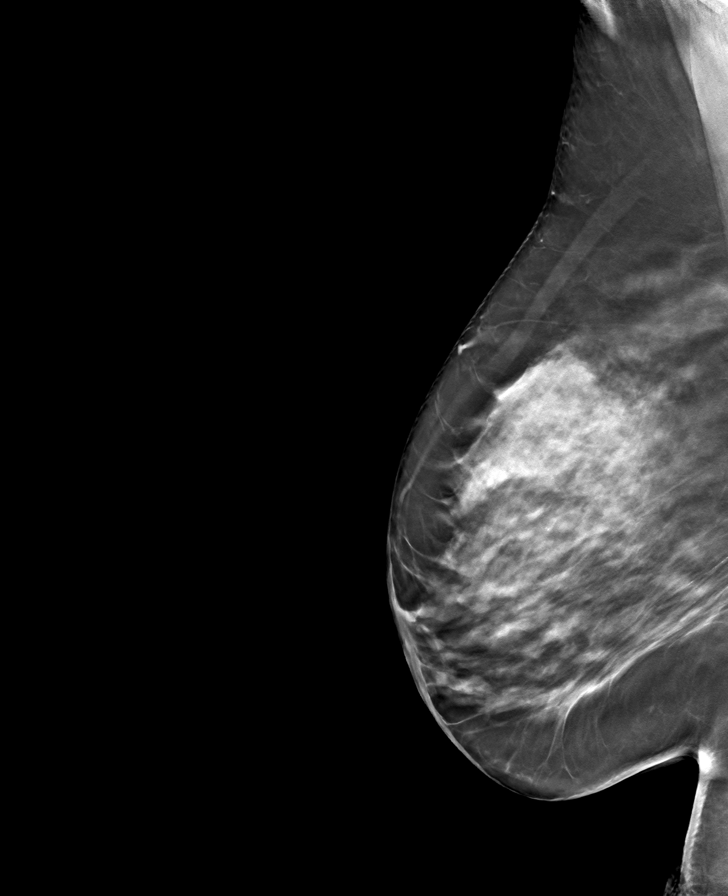

[L CC tomo · tomo slice 35/70.0]
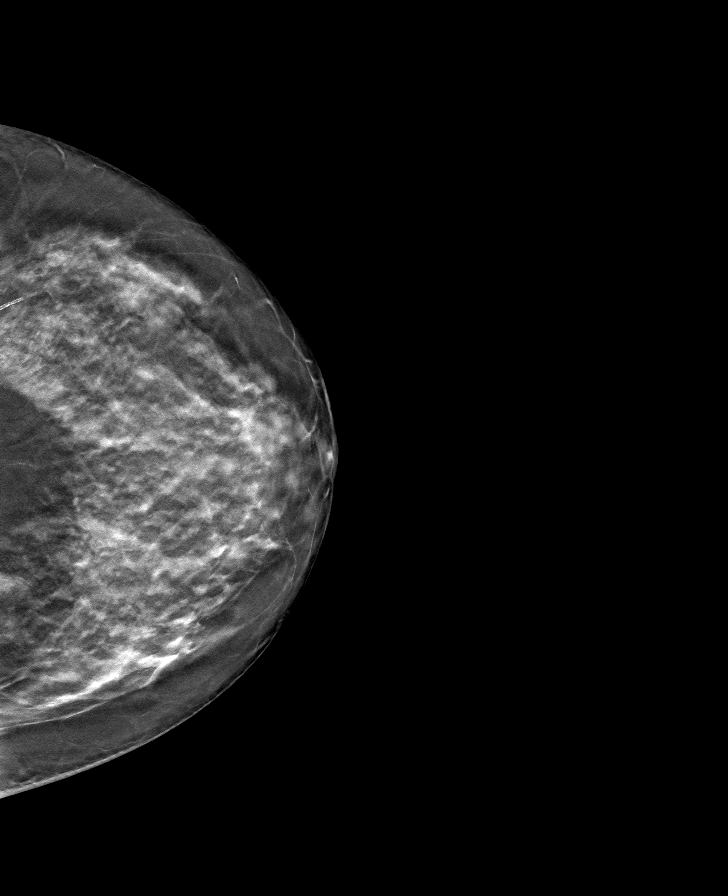

[L MLO tomo · tomo slice 41/81.0]
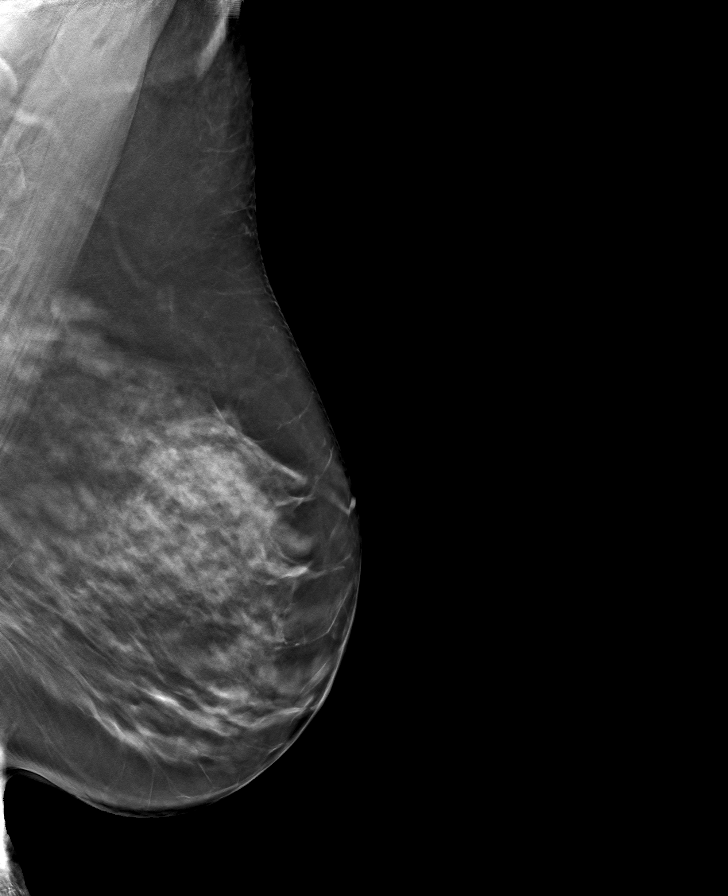

[8 of 24 positions shown; findings below may reference images not displayed]

ACR Breast Density Category c: The breast tissue is heterogeneously
dense, which may obscure small masses.
FINDINGS: There are no findings suspicious for malignancy. Images were
processed with CAD.
IMPRESSION: No mammographic evidence of malignancy. A result letter of this
screening mammogram will be mailed directly to the patient.

RECOMMENDATION:
Screening mammogram in one year. (Code:[id number])

BI-RADS CATEGORY  1: Negative.

## 2019-05-29 ENCOUNTER — Other Ambulatory Visit: Payer: Self-pay | Admitting: Neurology

## 2019-05-30 ENCOUNTER — Ambulatory Visit (INDEPENDENT_AMBULATORY_CARE_PROVIDER_SITE_OTHER): Payer: Medicare HMO | Admitting: *Deleted

## 2019-05-30 DIAGNOSIS — I6310 Cerebral infarction due to embolism of unspecified precerebral artery: Secondary | ICD-10-CM | POA: Diagnosis not present

## 2019-05-31 LAB — CUP PACEART REMOTE DEVICE CHECK
Date Time Interrogation Session: 20200923164151
Implantable Pulse Generator Implant Date: 20200309

## 2019-06-01 ENCOUNTER — Other Ambulatory Visit: Payer: Self-pay

## 2019-06-01 ENCOUNTER — Telehealth: Payer: Medicare HMO | Admitting: Family

## 2019-06-01 DIAGNOSIS — Z20822 Contact with and (suspected) exposure to covid-19: Secondary | ICD-10-CM

## 2019-06-01 DIAGNOSIS — R21 Rash and other nonspecific skin eruption: Secondary | ICD-10-CM

## 2019-06-01 MED ORDER — TRIAMCINOLONE ACETONIDE 0.025 % EX OINT: 1.0000 "application " | TOPICAL_OINTMENT | Freq: Two times a day (BID) | CUTANEOUS | 0 refills | Status: AC

## 2019-06-01 NOTE — Progress Notes (Signed)
E Visit for Rash  We are sorry that you are not feeling well. Here is how we plan to help!  Hello, As discussed, looking at your rash it first appears as scabies given the appearance, location of it, and your recent travel. However, since it does not itch I am ruling that out. It possible could be related to your Mobic and Plavix, however, usually that is more bluish or purple.   So ruling those out, it looks like some type of contract dermatitis. I have sent in a steroid cream, Kenalog 0.025%, that you will apply twice a day.   You can also get tested for COVID to rule this out. You can go to one of the  testing sites listed below, while they are opened (see hours). You do not need an order and will stay in your car during the test. You do need to self isolate until your results return and if positive 14 days from when your symptoms started and until you are 3 days symptom free.   Testing Locations (Monday - Friday, 8 a.m. - 3:30 p.m.) . [place]: [facility] at [facility], [address], [place], [place]: [place], [place], [place], [place] (entrance off [address])  . [facility]: (Closed each Monday): Testing site relocated to the short stay covered drive at [facility]. (Use the [employer] entrance to [facility] next to [address].)  Approximately 5 minutes was spent documenting and reviewing patient's chart.    HOME CARE:   Take cool showers and avoid direct sunlight.  Apply cool compress or wet dressings.  Take a bath in an oatmeal bath.  Sprinkle content of one Aveeno packet under running faucet with comfortably warm water.  Bathe for 15-20 minutes, 1-2 times daily.  Pat dry with a towel. Do not rub the rash.  Use hydrocortisone cream.  Take an antihistamine like Benadryl for widespread rashes that itch.  The adult dose of Benadryl is 25-50 mg by mouth 4 times  daily.  Caution:  This type of medication may cause sleepiness.  Do not drink alcohol, drive, or operate dangerous machinery while taking antihistamines.  Do not take these medications if you have prostate enlargement.  Read package instructions thoroughly on all medications that you take.  GET HELP RIGHT AWAY IF:   Symptoms don't go away after treatment.  Severe itching that persists.  If you rash spreads or swells.  If you rash begins to smell.  If it blisters and opens or develops a yellow-brown crust.  You develop a fever.  You have a sore throat.  You become short of breath.  MAKE SURE YOU:  Understand these instructions. Will watch your condition. Will get help right away if you are not doing well or get worse.  Thank you for choosing an e-visit. Your e-visit answers were reviewed by a board certified advanced clinical practitioner to complete your personal care plan. Depending upon the condition, your plan could have included both over the counter or prescription medications. Please review your pharmacy choice. Be sure that the pharmacy you have chosen is open so that you can pick up your prescription now.  If there is a problem you may message your provider in [place] to have the prescription routed to another pharmacy. Your safety is important to [place]. If you have drug allergies check your prescription carefully.  For the next 24 hours, you can use  MyChart to ask questions about today's visit, request a non-urgent call back, or ask for a work or school excuse from your e-visit provider. You will get an email in the next two days asking about your experience. I hope that your e-visit has been valuable and will speed your recovery.

## 2019-06-05 NOTE — Progress Notes (Signed)
Carelink Summary Report / Loop Recorder 

## 2019-07-02 ENCOUNTER — Ambulatory Visit (INDEPENDENT_AMBULATORY_CARE_PROVIDER_SITE_OTHER): Payer: Medicare HMO | Admitting: *Deleted

## 2019-07-02 DIAGNOSIS — R55 Syncope and collapse: Secondary | ICD-10-CM | POA: Diagnosis not present

## 2019-07-02 LAB — CUP PACEART REMOTE DEVICE CHECK
Date Time Interrogation Session: 20201026163647
Implantable Pulse Generator Implant Date: 20200309

## 2019-07-20 NOTE — Progress Notes (Signed)
Carelink Summary Report / Loop Recorder 

## 2019-08-05 LAB — CUP PACEART REMOTE DEVICE CHECK
Date Time Interrogation Session: 20201128150733
Implantable Pulse Generator Implant Date: 20200309

## 2019-08-06 ENCOUNTER — Ambulatory Visit (INDEPENDENT_AMBULATORY_CARE_PROVIDER_SITE_OTHER): Payer: Medicare HMO | Admitting: *Deleted

## 2019-08-06 DIAGNOSIS — R55 Syncope and collapse: Secondary | ICD-10-CM | POA: Diagnosis not present

## 2019-08-29 NOTE — Progress Notes (Signed)
ILR remote 

## 2019-09-06 ENCOUNTER — Ambulatory Visit (INDEPENDENT_AMBULATORY_CARE_PROVIDER_SITE_OTHER): Payer: Medicare HMO | Admitting: *Deleted

## 2019-09-06 DIAGNOSIS — I639 Cerebral infarction, unspecified: Secondary | ICD-10-CM

## 2019-09-07 LAB — CUP PACEART REMOTE DEVICE CHECK
Date Time Interrogation Session: 20201231152130
Implantable Pulse Generator Implant Date: 20200309

## 2019-10-08 ENCOUNTER — Ambulatory Visit (INDEPENDENT_AMBULATORY_CARE_PROVIDER_SITE_OTHER): Payer: Medicare HMO | Admitting: *Deleted

## 2019-10-08 DIAGNOSIS — I639 Cerebral infarction, unspecified: Secondary | ICD-10-CM

## 2019-10-08 LAB — CUP PACEART REMOTE DEVICE CHECK
Date Time Interrogation Session: 20210201004515
Implantable Pulse Generator Implant Date: 20200309

## 2019-10-08 NOTE — Progress Notes (Signed)
ILR Remote 

## 2019-10-15 MED ORDER — LEVETIRACETAM 500 MG PO TABS: 500.0000 mg | ORAL_TABLET | Freq: Two times a day (BID) | ORAL | 0 refills | Status: DC

## 2019-10-15 MED ORDER — CLOPIDOGREL BISULFATE 75 MG PO TABS: 75.0000 mg | ORAL_TABLET | Freq: Every day | ORAL | 1 refills | Status: DC

## 2019-11-08 ENCOUNTER — Ambulatory Visit (INDEPENDENT_AMBULATORY_CARE_PROVIDER_SITE_OTHER): Payer: Medicare HMO | Admitting: *Deleted

## 2019-11-08 DIAGNOSIS — I639 Cerebral infarction, unspecified: Secondary | ICD-10-CM | POA: Diagnosis not present

## 2019-11-08 LAB — CUP PACEART REMOTE DEVICE CHECK
Date Time Interrogation Session: 20210304031545
Implantable Pulse Generator Implant Date: 20200309

## 2019-11-08 NOTE — Progress Notes (Signed)
ILR Remote 

## 2019-11-20 ENCOUNTER — Telehealth: Payer: Self-pay | Admitting: Internal Medicine

## 2019-11-20 NOTE — Telephone Encounter (Signed)
Lindsey Lopez is requesting per patient schedule that her loop recorder be removed due to not having an episode since it was put in last March. Please advise.

## 2019-11-21 ENCOUNTER — Other Ambulatory Visit: Payer: Self-pay | Admitting: Neurology

## 2019-11-21 DIAGNOSIS — R569 Unspecified convulsions: Secondary | ICD-10-CM

## 2019-11-23 NOTE — Telephone Encounter (Signed)
Return phone call from pt.  Pt states copay for her loop is now $75.00/month.  Pt states she has had no events in a year and would like to have removed unless Dr Graciela Husbands recommends otherwise.  Pt advised will forward request to Dr Graciela Husbands for review.  Pt verbalizes understanding and agrees with plan.

## 2019-11-23 NOTE — Telephone Encounter (Signed)
Attempted phone call to pt.  Left message for pt to contact RN at [phone].

## 2019-11-26 NOTE — Telephone Encounter (Signed)
Would suggest she stop monitoring and we can see her every 6 months in the office  if that works better for ir

## 2019-11-26 NOTE — Telephone Encounter (Signed)
Spoke with pt and advised per Dr Clide Cliff OK to have loop recorder removed and will schedule 6 month f/u visits.  Pt verbalizes understanding and agrees with current plan.

## 2019-12-10 ENCOUNTER — Ambulatory Visit (INDEPENDENT_AMBULATORY_CARE_PROVIDER_SITE_OTHER): Payer: Medicare HMO | Admitting: *Deleted

## 2019-12-10 DIAGNOSIS — I639 Cerebral infarction, unspecified: Secondary | ICD-10-CM | POA: Diagnosis not present

## 2019-12-10 LAB — CUP PACEART REMOTE DEVICE CHECK
Date Time Interrogation Session: 20210404041752
Implantable Pulse Generator Implant Date: 20200309

## 2019-12-11 NOTE — Progress Notes (Signed)
ILR Remote 

## 2019-12-12 ENCOUNTER — Other Ambulatory Visit: Payer: Medicare HMO

## 2019-12-20 MED ORDER — CLOPIDOGREL BISULFATE 75 MG PO TABS: 75.0000 mg | ORAL_TABLET | Freq: Every day | ORAL | 1 refills | Status: DC

## 2019-12-26 ENCOUNTER — Encounter: Payer: Self-pay | Admitting: Family Medicine

## 2019-12-26 ENCOUNTER — Ambulatory Visit: Payer: Self-pay | Admitting: Family Medicine

## 2020-01-10 LAB — CUP PACEART REMOTE DEVICE CHECK
Date Time Interrogation Session: 20210505042204
Implantable Pulse Generator Implant Date: 20200309

## 2020-01-12 DIAGNOSIS — Z9889 Other specified postprocedural states: Secondary | ICD-10-CM | POA: Insufficient documentation

## 2020-01-14 ENCOUNTER — Other Ambulatory Visit: Payer: Self-pay

## 2020-01-14 ENCOUNTER — Ambulatory Visit: Payer: Medicare HMO | Admitting: Internal Medicine

## 2020-01-14 ENCOUNTER — Ambulatory Visit (INDEPENDENT_AMBULATORY_CARE_PROVIDER_SITE_OTHER): Payer: Medicare HMO | Admitting: *Deleted

## 2020-01-14 ENCOUNTER — Encounter: Payer: Self-pay | Admitting: Internal Medicine

## 2020-01-14 VITALS — BP 120/84 | HR 90 | Ht 64.5 in | Wt 168.4 lb

## 2020-01-14 DIAGNOSIS — I6310 Cerebral infarction due to embolism of unspecified precerebral artery: Secondary | ICD-10-CM | POA: Diagnosis not present

## 2020-01-14 DIAGNOSIS — I639 Cerebral infarction, unspecified: Secondary | ICD-10-CM

## 2020-01-14 DIAGNOSIS — Z9889 Other specified postprocedural states: Secondary | ICD-10-CM

## 2020-01-14 DIAGNOSIS — R55 Syncope and collapse: Secondary | ICD-10-CM

## 2020-01-14 NOTE — Progress Notes (Signed)
Patient Care Team: Ignacia Marvel, DO as PCP - General (Family Medicine)   HPI  Lindsey Lopez is a 74 y.o. female seen for loop recorder explant-- sen for  possible seizures.  Evaluation included MRI scanning which upon her review demonstrated a small peripheral left cerebellar infarction--MRI 1/20 demonstrated new occipital stroke which appeared embolic.  CT of the head and neck was without etiology.  She was on aspirin at the time; and she has been transitioned to Plavix.     DATE TEST EF   3/16 Myoview  57 % Normal perfusion  1/16 Echo  55-65 %   2/20 Echo  60-65% Neg Bubble study   Date Cr K Hgb  1/21  0.8 3.8 15.6         No intervral neurological events   The patient denies chest pain, shortness of breath, nocturnal dyspnea, orthopnea or peripheral edema.  There have been no palpitations, lightheadedness or syncope.    Records and Results Reviewed   Past Medical History:  Diagnosis Date  . Anxiety   . CVA (cerebral vascular accident) (HCC) 09/24/2014   TPA reversed the symptoms  . Dysphagia    since choking in August 2016; receiving swallowing evaluations   . High cholesterol   . Seizure [facility])     Past Surgical History:  Procedure Laterality Date  . BACK SURGERY  07-06-10   "Drill out" surgery on lower back  . ESOPHAGOGASTRODUODENOSCOPY (EGD) WITH PROPOFOL N/A 06/09/2017   Procedure: ESOPHAGOGASTRODUODENOSCOPY (EGD) WITH PROPOFOL;  Surgeon: Toney Reil, MD;  Location: [facility] ENDOSCOPY;  Service: Gastroenterology;  Laterality: N/A;  . Herniated disc  01-2000  . LOOP RECORDER INSERTION N/A 11/13/2018   Procedure: LOOP RECORDER INSERTION;  Surgeon: Duke Salvia, MD;  Location: [facility] INVASIVE CV LAB;  Service: Cardiovascular;  Laterality: N/A;  . Neck fusion  04-03-10   4 leve     Current Meds  Medication Sig  . citalopram (CELEXA) 20 MG tablet Take 10 mg by mouth 2 (two) times daily.   . clopidogrel (PLAVIX) 75 MG tablet Take 1 tablet  (75 mg total) by mouth daily.  . fluvastatin XL (LESCOL XL) 80 MG 24 hr tablet Take 80 mg by mouth at bedtime.   . levETIRAcetam (KEPPRA) 500 MG tablet Take 1 tablet (500 mg total) by mouth 2 (two) times daily.  Marland Kitchen LORazepam (ATIVAN) 0.5 MG tablet Take 1/2 tablet(0.25mg ) or a whole tablet(0.5mg ) for panic attack as needed up to twice daily.  . Multiple Vitamins-Minerals (CENTRUM SILVER 50+WOMEN PO) Take 1 tablet by mouth daily.  Marland Kitchen triamcinolone (KENALOG) 0.025 % ointment Apply 1 application topically 2 (two) times daily.  . valACYclovir (VALTREX) 500 MG tablet Take 500 mg by mouth at bedtime.     Allergies  Allergen Reactions  . Codeine Anaphylaxis  . Phenobarbital     Shaky   . Ultram [Tramadol]     Shaky   . Morphine And Related Other (See Comments)    Crawling on the ceiling       Review of Systems negative except from HPI and PMH  Physical Exam BP 120/84   Pulse 90   Ht 5' 4.5" (1.638 m)   Wt 168 lb 6.4 oz (76.4 kg)   LMP 06/12/1989 (Exact Date)   BMI 28.46 kg/m  Well developed and well nourished in no acute distress HENT normal E scleral and icterus clear Neck Supple JVP flat; carotids brisk and full Clear to ausculation  Regular rate and rhythm, no murmurs gallops or rub Soft with active bowel sounds No clubbing cyanosis  Edema Alert and oriented, grossly normal motor and sensory function Skin Warm and Dry  ECG  CrCl cannot be calculated (Patient's most recent lab result is older than the maximum 21 days allowed.).   Assessment and  Plan Stroke-recurrent  Hypertension  Syncope-presumed neurally mediated  Implantable loop    CZARINA GINGRAS [id number]  [id number]  Preop UR:[id number] Stroke ; prev implanted loop recorder Postop Dx same/  Procedure:explant  Lidocaine local anesthesia and incision over the proximal end; removal and steristrip dressing applied  Cx: None     Virl Axe, MD 01/14/2020 10:11 AM    Current medicines are  reviewed at length with the patient today .  The patient does not have concerns regarding medicines.

## 2020-01-14 NOTE — Patient Instructions (Signed)
Medication Instructions:  Your physician recommends that you continue on your current medications as directed. Please refer to the Current Medication list given to you today.  Labwork: None ordered.  Testing/Procedures: Removal of Internal Loop Recorder  Follow-Up:  Your physician wants you to follow-up in: 1 year with Dr Caryl Comes. You will receive a reminder letter in the mail two months in advance. If you don't receive a letter, please call our office to schedule the follow-up appointment.     Any Other Special Instructions Will Be Listed Below (If Applicable).   Wound Care, Adult Taking care of your wound properly can help to prevent pain, infection, and scarring. It can also help your wound to heal more quickly. How to care for your wound Wound care      Follow instructions from your health care provider about how to take care of your wound. Make sure you: ? Wash your hands with soap and water before you change the bandage (dressing). If soap and water are not available, use hand sanitizer. ? Change your dressing as told by your health care provider. ? Leave stitches (sutures), skin glue, or adhesive strips in place. These skin closures may need to stay in place for 2 weeks or longer. If adhesive strip edges start to loosen and curl up, you may trim the loose edges. Do not remove adhesive strips completely unless your health care provider tells you to do that.  Check your wound area every day for signs of infection. Check for: ? Redness, swelling, or pain. ? Fluid or blood. ? Warmth. ? Pus or a bad smell.  Ask your health care provider if you should clean the wound with mild soap and water. Doing this may include: ? Using a clean towel to pat the wound dry after cleaning it. Do not rub or scrub the wound. ? Applying a cream or ointment. Do this only as told by your health care provider. ? Covering the incision with a clean dressing.  Ask your health care provider when you  can leave the wound uncovered.  Keep the dressing dry until your health care provider says it can be removed. Do not take baths, swim, use a hot tub, or do anything that would put the wound underwater until your health care provider approves. Ask your health care provider if you can take showers. You may only be allowed to take sponge baths. Medicines   If you were prescribed an antibiotic medicine, cream, or ointment, take or use the antibiotic as told by your health care provider. Do not stop taking or using the antibiotic even if your condition improves.  Take over-the-counter and prescription medicines only as told by your health care provider. If you were prescribed pain medicine, take it 30 or more minutes before you do any wound care or as told by your health care provider. General instructions  Return to your normal activities as told by your health care provider. Ask your health care provider what activities are safe.  Do not scratch or pick at the wound.  Do not use any products that contain nicotine or tobacco, such as cigarettes and e-cigarettes. These may delay wound healing. If you need help quitting, ask your health care provider.  Keep all follow-up visits as told by your health care provider. This is important.  Eat a diet that includes protein, vitamin A, vitamin C, and other nutrient-rich foods to help the wound heal. ? Foods rich in protein include meat, dairy, beans, nuts,  and other sources. ? Foods rich in vitamin A include carrots and dark green, leafy vegetables. ? Foods rich in vitamin C include citrus, tomatoes, and other fruits and vegetables. ? Nutrient-rich foods have protein, carbohydrates, fat, vitamins, or minerals. Eat a variety of healthy foods including vegetables, fruits, and whole grains. Contact a health care provider if:  You received a tetanus shot and you have swelling, severe pain, redness, or bleeding at the injection site.  Your pain is not  controlled with medicine.  You have redness, swelling, or pain around the wound.  You have fluid or blood coming from the wound.  Your wound feels warm to the touch.  You have pus or a bad smell coming from the wound.  You have a fever or chills.  You are nauseous or you vomit.  You are dizzy. Get help right away if:  You have a red streak going away from your wound.  The edges of the wound open up and separate.  Your wound is bleeding, and the bleeding does not stop with gentle pressure.  You have a rash.  You faint.  You have trouble breathing. Summary  Always wash your hands with soap and water before changing your bandage (dressing).  To help with healing, eat foods that are rich in protein, vitamin A, vitamin C, and other nutrients.  Check your wound every day for signs of infection. Contact your health care provider if you suspect that your wound is infected. This information is not intended to replace advice given to you by your health care provider. Make sure you discuss any questions you have with your health care provider. Document Revised: 12/11/2018 Document Reviewed: 03/09/2016 Elsevier Patient Education  [employer].   If you need a refill on your cardiac medications before your next appointment, please call your pharmacy.

## 2020-01-14 NOTE — Progress Notes (Signed)
Carelink Summary Report / Loop Recorder 

## 2020-01-28 ENCOUNTER — Telehealth: Payer: Self-pay

## 2020-01-28 NOTE — Telephone Encounter (Signed)
Spoke with patient to inform of disconnected monitor. Patient states she was explanted two weeks ago.

## 2020-02-21 ENCOUNTER — Other Ambulatory Visit: Payer: Self-pay | Admitting: Neurology

## 2020-02-27 ENCOUNTER — Other Ambulatory Visit: Payer: Self-pay | Admitting: Neurology

## 2020-05-21 ENCOUNTER — Other Ambulatory Visit: Payer: Self-pay | Admitting: Neurology

## 2020-11-11 ENCOUNTER — Telehealth: Payer: Self-pay | Admitting: Neurology

## 2020-11-11 NOTE — Telephone Encounter (Signed)
Lindsey Lopez from [facility] is requesting a 30 day supply refill for levETIRAcetam (KEPPRA) 500 MG tablet.  Best contact: [phone] opt. 2

## 2020-11-12 NOTE — Telephone Encounter (Signed)
Patient last seen May 2020.  The last notes we have on file was a telephone visit a year ago in which patient was going to be stopping the Keppra.  The last time a refill was sent in was September 2021 for 30 days.  I called the patient and LVM (ok per DPR) asking for call back ASAP to discuss her medication.

## 2020-11-13 NOTE — Telephone Encounter (Signed)
Spoke with patient. She stated she has been living with her son in [place] and is being treated at [facility]. She did confirm she is still on the Keppra and Valtrex that they recommended she not stop. Patient verbalized appreciation for the call and concern. Will not send in a refill at this time since she is now a patient at [facility].

## 2021-09-07 ENCOUNTER — Other Ambulatory Visit: Payer: Self-pay

## 2021-09-07 ENCOUNTER — Encounter (HOSPITAL_COMMUNITY): Payer: Self-pay | Admitting: Emergency Medicine

## 2021-09-07 ENCOUNTER — Emergency Department (HOSPITAL_COMMUNITY): Payer: Medicare HMO

## 2021-09-07 ENCOUNTER — Emergency Department (HOSPITAL_COMMUNITY)
Admission: EM | Admit: 2021-09-07 | Discharge: 2021-09-07 | Disposition: A | Discharge status: Home / Self Care | Payer: Medicare HMO | Attending: Emergency Medicine | Admitting: Emergency Medicine

## 2021-09-07 DIAGNOSIS — S0990XA Unspecified injury of head, initial encounter: Secondary | ICD-10-CM | POA: Insufficient documentation

## 2021-09-07 DIAGNOSIS — W11XXXA Fall on and from ladder, initial encounter: Secondary | ICD-10-CM | POA: Insufficient documentation

## 2021-09-07 DIAGNOSIS — Z7902 Long term (current) use of antithrombotics/antiplatelets: Secondary | ICD-10-CM | POA: Insufficient documentation

## 2021-09-07 DIAGNOSIS — M546 Pain in thoracic spine: Secondary | ICD-10-CM | POA: Insufficient documentation

## 2021-09-07 DIAGNOSIS — M25552 Pain in left hip: Secondary | ICD-10-CM | POA: Insufficient documentation

## 2021-09-07 DIAGNOSIS — W19XXXA Unspecified fall, initial encounter: Secondary | ICD-10-CM

## 2021-09-07 MED ORDER — LIDOCAINE 5 % EX PTCH
1.0000 | MEDICATED_PATCH | CUTANEOUS | Status: DC
  Administered 2021-09-07: 1 via TRANSDERMAL
  Filled 2021-09-07: qty 1

## 2021-09-07 MED ORDER — LIDOCAINE 5 % EX PTCH: 1.0000 | MEDICATED_PATCH | Freq: Every day | CUTANEOUS | 0 refills | Status: AC | PRN

## 2021-09-07 MED ORDER — LIDOCAINE 5 % EX PTCH: 1.0000 | MEDICATED_PATCH | Freq: Every day | CUTANEOUS | 0 refills | Status: DC | PRN

## 2021-09-07 MED ORDER — OXYCODONE-ACETAMINOPHEN 5-325 MG PO TABS
1.0000 | ORAL_TABLET | ORAL | Status: AC | PRN
  Administered 2021-09-07 (×2): 1 via ORAL
  Filled 2021-09-07 (×2): qty 1

## 2021-09-07 NOTE — ED Triage Notes (Addendum)
Patient coming from home, fell approx 67ft from ladder. No LOC. VSS. Pt does take Plavix, unknown if hit head.

## 2021-09-07 NOTE — ED Provider Notes (Signed)
[facility] EMERGENCY DEPARTMENT Provider Note   CSN: [id number] Arrival date & time: 09/07/21  1204     History  Chief Complaint  Patient presents with   Lindsey Lopez    Lindsey Lopez is a 76 y.o. female with a past medical history of CVA in 2016 in 2020 presenting today after fall.  Patient was on a ladder and fell approximately 5 feet, landing on the right side of her body.  Complains of back pain and left hip pain.  States she heard a crack when she fell.  Denies hitting her head on however reports that she did lose consciousness due to anxiety.  Denies history of syncope or seizures.  No cardiac history.   Home Medications Prior to Admission medications   Medication Sig Start Date End Date Taking? Authorizing Provider  citalopram (CELEXA) 20 MG tablet Take 10 mg by mouth 2 (two) times daily.     [provider]  clopidogrel (PLAVIX) 75 MG tablet Take 1 tablet by mouth once daily 02/25/20   [name], MD  fluvastatin XL (LESCOL XL) 80 MG 24 hr tablet Take 80 mg by mouth at bedtime.     [provider]  levETIRAcetam (KEPPRA) 500 MG tablet TAKE 1 TABLET TWICE A DAY 05/21/20   [name], MD  LORazepam (ATIVAN) 0.5 MG tablet Take 1/2 tablet(0.25mg ) or a whole tablet(0.5mg ) for panic attack as needed up to twice daily. 11/14/18   [name], MD  Multiple Vitamins-Minerals (CENTRUM SILVER 50+WOMEN PO) Take 1 tablet by mouth daily.    [provider]  triamcinolone (KENALOG) 0.025 % ointment Apply 1 application topically 2 (two) times daily. 06/01/19   Junie Spencer, FNP  valACYclovir (VALTREX) 500 MG tablet Take 500 mg by mouth at bedtime.  04/27/17   [provider]      Allergies    Codeine, Phenobarbital, Ultram [tramadol], and Morphine and related    Review of Systems   Review of Systems  Cardiovascular:  Negative for palpitations.  Musculoskeletal:  Positive for back pain and gait problem. Negative for joint  swelling and neck pain.  Skin:  Negative for wound.  Neurological:  Positive for weakness. Negative for seizures, syncope, light-headedness and headaches.  All other systems reviewed and are negative.  Physical Exam Updated Vital Signs BP 128/76 (BP Location: Left Arm)    Pulse 72    Temp 98.4 F (36.9 C)    Resp 15    LMP 06/12/1989 (Exact Date)    SpO2 100%  Physical Exam Vitals and nursing note reviewed.  Constitutional:      Appearance: Normal appearance.  HENT:     Head: Normocephalic and atraumatic.  Eyes:     General: No scleral icterus.    Conjunctiva/sclera: Conjunctivae normal.  Pulmonary:     Effort: Pulmonary effort is normal. No respiratory distress.  Musculoskeletal:        General: Tenderness (Midline and, L4-L5.) present. No swelling.     Cervical back: Normal range of motion. No tenderness.     Comments: Tenderness to right-sided paraspinal muscles of lumbar spine.  Full range of motion of the lumbar spine but pain with rotation, worse when rotating to the left.  Hip range of motion limited due to pain.  Skin:    General: Skin is warm and dry.     Findings: No bruising, erythema, lesion or rash.  Neurological:     Mental Status: She is alert.  Gait: Gait abnormal (Unable to bear weight).  Psychiatric:        Mood and Affect: Mood normal.        Behavior: Behavior normal.    ED Results / Procedures / Treatments   Labs (all labs ordered are listed, but only abnormal results are displayed) Labs Reviewed - No data to display  EKG None  Radiology DG Hip Unilat  With Pelvis 2-3 Views Right  Result Date: 09/07/2021 CLINICAL DATA:  Right hip pain after fall. EXAM: DG HIP (WITH OR WITHOUT PELVIS) 2-3V RIGHT COMPARISON:  None. FINDINGS: There is no evidence of hip fracture or dislocation. No significant joint space narrowing is noted. Mild osteophyte formation of right hip is noted. IMPRESSION: Mild degenerative joint disease of right hip. No acute abnormality  is seen. Electronically Signed   By: Lupita Raider M.D.   On: 09/07/2021 13:03    Procedures Procedures    Medications Ordered in ED Medications  oxyCODONE-acetaminophen (PERCOCET/ROXICET) 5-325 MG per tablet 1 tablet (1 tablet Oral Given 09/07/21 1235)    ED Course/ Medical Decision Making/ A&P                           [name] presents to the ED for a fall on blood thinners.  She reports that the cause of the fall was purely mechanical however other considerations include but are not limited to cardiac arrhythmia, seizures, hypertension, hypoglycemia, cerebral hemorrhage/ischemia.    Co morbidities that complicate the patient evaluation  Currently prescribed Plavix for CVA history.   Additional history obtained:  Additional history obtained from chart review.  Patient's CVA was occipital in 2021 and reportedly cerebellar in 2016.  Patient denied history of seizures however per chart review with [facility]surgery, patient was previously on Keppra for seizures however neurology attempted to wean her off antiepileptics at the end of 2021.  Elected to stay on Keppra at that time.  She also had episodes of syncope that were diagnosed as vasovagal at that time.  Imaging Studies ordered:  I ordered imaging studies including DG hip, lumbar spine and CT hip. -I independently visualized and interpreted the right hip and pelvis x-ray which was negative. -DG lumbar spine: pending at sign out -CT head and hip pending at sign out.    Medicines ordered and prescription drug management:  The patient was given 1 Percocet in triage and she reported that this helped her pain however she did not want any more because she is afraid to take too much opioid medications.   I have reviewed the patients home medicines and have made adjustments as needed   Dispostion:  -DG lumbar spine and CT head and hip pending at sign out. Signed out to MD Wallace Cullens, see his note for results and ultimate dispo.  I  suspect patient will be discharged home if scans are negative and she is able to ambulate.  Otherwise orthopedics may need to be consulted.  Medical Decision Making        Final Clinical Impression(s) / ED Diagnoses Final diagnoses:  Fall, initial encounter     Saddie Benders, PA-C 09/07/21 1903    Sloan Leiter, DO 09/08/21 0011

## 2021-09-07 NOTE — ED Notes (Signed)
Pt has 2+ pedal pulse bilat, cap refill less than 3 sec, pt able to wiggle toes bilat. Pt states has numbness and tingling off feet at baseline.

## 2021-09-07 NOTE — ED Provider Notes (Signed)
°  Provider Note MRN:  [id number]  Arrival date & time: 09/07/21    ED Course and Medical Decision Making  Assumed care from Dr Durwin Nora at shift change.  See not from prior team for complete details, in brief: pt with fall from ladder PTA, pain to her right side, positive head injury.  Plan per prior physician follow-up CT imaging.  Ambulate.  CT imaging reviewed, no acute fracture.  Patient is able to ambulate.  Pain greatly improved.  Patient does have some nausea secondary to the medications that were given earlier in her stay but is able to ambulate with a steady gait.  Patient given outpatient orthopedics follow-up.  She does not want any further narcotic medications for her discomfort. She would prefer to take motrin/tylenol as needed.   The patient improved significantly and was discharged in stable condition. Detailed discussions were had with the patient regarding current findings, and need for close f/u with PCP or on call doctor. The patient has been instructed to return immediately if the symptoms worsen in any way for re-evaluation. Patient verbalized understanding and is in agreement with current care plan. All questions answered prior to discharge.    Procedures  Final Clinical Impressions(s) / ED Diagnoses     ICD-10-CM   1. Fall, initial encounter  W19.[facility]       ED Discharge Orders          Ordered    lidocaine (LIDODERM) 5 %  Daily PRN,   Status:  Discontinued        09/07/21 2123    lidocaine (LIDODERM) 5 %  Daily PRN        09/07/21 2124              Discharge Instructions      Please follow-up with orthopedic doctor attached to these discharge papers as needed.   Please return to emergency department for any worsening or worrisome symptoms.  You may take Motrin or Tylenol as needed for discomfort.        Sloan Leiter, DO 09/07/21 2334

## 2021-09-07 NOTE — ED Notes (Signed)
Patient transported to CT 

## 2021-09-07 NOTE — Discharge Instructions (Addendum)
Please follow-up with orthopedic doctor attached to these discharge papers as needed.   Please return to emergency department for any worsening or worrisome symptoms.  You may take Motrin or Tylenol as needed for discomfort.

## 2021-09-07 NOTE — ED Notes (Signed)
Patient transported to X-ray
